# Patient Record
Sex: Female | Born: 1945 | Race: Black or African American | Hispanic: No | Smoking: Never smoker
Health system: Southern US, Community
[De-identification: ages and names within clinical notes are randomized; demographics above are authoritative.]

## PROBLEM LIST (undated history)

## (undated) DIAGNOSIS — E785 Hyperlipidemia, unspecified: Secondary | ICD-10-CM

## (undated) DIAGNOSIS — K219 Gastro-esophageal reflux disease without esophagitis: Secondary | ICD-10-CM

## (undated) DIAGNOSIS — I10 Essential (primary) hypertension: Secondary | ICD-10-CM

## (undated) DIAGNOSIS — I639 Cerebral infarction, unspecified: Secondary | ICD-10-CM

## (undated) HISTORY — DX: Cerebral infarction, unspecified: I63.9

## (undated) HISTORY — PX: KNEE SURGERY: SHX244

## (undated) HISTORY — PX: ABDOMINAL HYSTERECTOMY: SHX81

---

## 2012-02-23 ENCOUNTER — Other Ambulatory Visit: Payer: Self-pay | Admitting: Family Medicine

## 2012-02-23 DIAGNOSIS — Z1231 Encounter for screening mammogram for malignant neoplasm of breast: Secondary | ICD-10-CM

## 2012-03-26 ENCOUNTER — Ambulatory Visit
Admission: RE | Admit: 2012-03-26 | Discharge: 2012-03-26 | Disposition: A | Discharge status: Home / Self Care | Payer: Medicare PPO | Source: Ambulatory Visit | Attending: Family Medicine | Admitting: Family Medicine

## 2012-03-26 DIAGNOSIS — Z1231 Encounter for screening mammogram for malignant neoplasm of breast: Secondary | ICD-10-CM

## 2013-03-18 ENCOUNTER — Other Ambulatory Visit: Payer: Self-pay

## 2013-03-18 DIAGNOSIS — Z1231 Encounter for screening mammogram for malignant neoplasm of breast: Secondary | ICD-10-CM

## 2013-04-17 ENCOUNTER — Ambulatory Visit
Admission: RE | Admit: 2013-04-17 | Discharge: 2013-04-17 | Disposition: A | Discharge status: Home / Self Care | Payer: Medicare PPO | Source: Ambulatory Visit

## 2013-04-17 DIAGNOSIS — Z1231 Encounter for screening mammogram for malignant neoplasm of breast: Secondary | ICD-10-CM

## 2014-03-19 ENCOUNTER — Other Ambulatory Visit: Payer: Self-pay

## 2014-03-19 DIAGNOSIS — Z1231 Encounter for screening mammogram for malignant neoplasm of breast: Secondary | ICD-10-CM

## 2014-04-30 ENCOUNTER — Encounter (INDEPENDENT_AMBULATORY_CARE_PROVIDER_SITE_OTHER): Payer: Self-pay

## 2014-04-30 ENCOUNTER — Ambulatory Visit
Admission: RE | Admit: 2014-04-30 | Discharge: 2014-04-30 | Disposition: A | Discharge status: Home / Self Care | Payer: Commercial Managed Care - HMO | Source: Ambulatory Visit

## 2014-04-30 DIAGNOSIS — Z1231 Encounter for screening mammogram for malignant neoplasm of breast: Secondary | ICD-10-CM

## 2014-05-05 ENCOUNTER — Other Ambulatory Visit: Payer: Self-pay | Admitting: Family Medicine

## 2014-05-05 DIAGNOSIS — R928 Other abnormal and inconclusive findings on diagnostic imaging of breast: Secondary | ICD-10-CM

## 2014-05-25 ENCOUNTER — Other Ambulatory Visit: Payer: Commercial Managed Care - HMO

## 2014-05-26 ENCOUNTER — Ambulatory Visit
Admission: RE | Admit: 2014-05-26 | Discharge: 2014-05-26 | Disposition: A | Discharge status: Home / Self Care | Payer: Commercial Managed Care - HMO | Source: Ambulatory Visit | Attending: Family Medicine | Admitting: Family Medicine

## 2014-05-26 DIAGNOSIS — R928 Other abnormal and inconclusive findings on diagnostic imaging of breast: Secondary | ICD-10-CM

## 2014-11-17 DIAGNOSIS — K219 Gastro-esophageal reflux disease without esophagitis: Secondary | ICD-10-CM | POA: Diagnosis not present

## 2014-11-17 DIAGNOSIS — I10 Essential (primary) hypertension: Secondary | ICD-10-CM | POA: Diagnosis not present

## 2015-04-28 DIAGNOSIS — D123 Benign neoplasm of transverse colon: Secondary | ICD-10-CM | POA: Diagnosis not present

## 2015-04-28 DIAGNOSIS — D126 Benign neoplasm of colon, unspecified: Secondary | ICD-10-CM | POA: Diagnosis not present

## 2015-04-28 DIAGNOSIS — K5730 Diverticulosis of large intestine without perforation or abscess without bleeding: Secondary | ICD-10-CM | POA: Diagnosis not present

## 2015-04-28 DIAGNOSIS — Z1211 Encounter for screening for malignant neoplasm of colon: Secondary | ICD-10-CM | POA: Diagnosis not present

## 2015-04-28 DIAGNOSIS — K640 First degree hemorrhoids: Secondary | ICD-10-CM | POA: Diagnosis not present

## 2015-05-18 DIAGNOSIS — R200 Anesthesia of skin: Secondary | ICD-10-CM | POA: Diagnosis not present

## 2015-05-18 DIAGNOSIS — Z136 Encounter for screening for cardiovascular disorders: Secondary | ICD-10-CM | POA: Diagnosis not present

## 2015-05-18 DIAGNOSIS — K219 Gastro-esophageal reflux disease without esophagitis: Secondary | ICD-10-CM | POA: Diagnosis not present

## 2015-05-18 DIAGNOSIS — I10 Essential (primary) hypertension: Secondary | ICD-10-CM | POA: Diagnosis not present

## 2015-05-18 DIAGNOSIS — Z1389 Encounter for screening for other disorder: Secondary | ICD-10-CM | POA: Diagnosis not present

## 2015-05-18 DIAGNOSIS — E559 Vitamin D deficiency, unspecified: Secondary | ICD-10-CM | POA: Diagnosis not present

## 2015-05-18 DIAGNOSIS — Z0000 Encounter for general adult medical examination without abnormal findings: Secondary | ICD-10-CM | POA: Diagnosis not present

## 2015-05-25 DIAGNOSIS — Z23 Encounter for immunization: Secondary | ICD-10-CM | POA: Diagnosis not present

## 2015-08-12 ENCOUNTER — Other Ambulatory Visit: Payer: Self-pay

## 2015-08-12 DIAGNOSIS — Z1231 Encounter for screening mammogram for malignant neoplasm of breast: Secondary | ICD-10-CM

## 2015-08-30 ENCOUNTER — Ambulatory Visit
Admission: RE | Admit: 2015-08-30 | Discharge: 2015-08-30 | Disposition: A | Discharge status: Home / Self Care | Payer: Commercial Managed Care - HMO | Source: Ambulatory Visit

## 2015-08-30 DIAGNOSIS — Z1231 Encounter for screening mammogram for malignant neoplasm of breast: Secondary | ICD-10-CM

## 2016-01-27 DIAGNOSIS — I10 Essential (primary) hypertension: Secondary | ICD-10-CM | POA: Diagnosis not present

## 2016-02-24 ENCOUNTER — Observation Stay (HOSPITAL_COMMUNITY)
Admission: EM | Admit: 2016-02-24 | Discharge: 2016-02-26 | Disposition: A | Discharge status: Home / Self Care | Payer: Commercial Managed Care - HMO | Attending: Internal Medicine | Admitting: Internal Medicine

## 2016-02-24 ENCOUNTER — Emergency Department (HOSPITAL_COMMUNITY): Payer: Commercial Managed Care - HMO

## 2016-02-24 ENCOUNTER — Encounter (HOSPITAL_COMMUNITY): Payer: Self-pay | Admitting: Emergency Medicine

## 2016-02-24 DIAGNOSIS — E860 Dehydration: Secondary | ICD-10-CM | POA: Insufficient documentation

## 2016-02-24 DIAGNOSIS — R42 Dizziness and giddiness: Secondary | ICD-10-CM | POA: Diagnosis not present

## 2016-02-24 DIAGNOSIS — H818X9 Other disorders of vestibular function, unspecified ear: Secondary | ICD-10-CM

## 2016-02-24 DIAGNOSIS — I10 Essential (primary) hypertension: Secondary | ICD-10-CM | POA: Insufficient documentation

## 2016-02-24 DIAGNOSIS — H8110 Benign paroxysmal vertigo, unspecified ear: Secondary | ICD-10-CM

## 2016-02-24 DIAGNOSIS — Z823 Family history of stroke: Secondary | ICD-10-CM | POA: Insufficient documentation

## 2016-02-24 DIAGNOSIS — K219 Gastro-esophageal reflux disease without esophagitis: Secondary | ICD-10-CM | POA: Insufficient documentation

## 2016-02-24 DIAGNOSIS — G459 Transient cerebral ischemic attack, unspecified: Secondary | ICD-10-CM | POA: Diagnosis not present

## 2016-02-24 DIAGNOSIS — E785 Hyperlipidemia, unspecified: Secondary | ICD-10-CM | POA: Diagnosis not present

## 2016-02-24 DIAGNOSIS — Z6833 Body mass index (BMI) 33.0-33.9, adult: Secondary | ICD-10-CM | POA: Insufficient documentation

## 2016-02-24 DIAGNOSIS — Z7982 Long term (current) use of aspirin: Secondary | ICD-10-CM | POA: Insufficient documentation

## 2016-02-24 DIAGNOSIS — E669 Obesity, unspecified: Secondary | ICD-10-CM | POA: Diagnosis not present

## 2016-02-24 DIAGNOSIS — I672 Cerebral atherosclerosis: Secondary | ICD-10-CM | POA: Diagnosis not present

## 2016-02-24 DIAGNOSIS — R404 Transient alteration of awareness: Secondary | ICD-10-CM | POA: Diagnosis not present

## 2016-02-24 DIAGNOSIS — R2990 Unspecified symptoms and signs involving the nervous system: Secondary | ICD-10-CM | POA: Insufficient documentation

## 2016-02-24 DIAGNOSIS — E876 Hypokalemia: Secondary | ICD-10-CM | POA: Insufficient documentation

## 2016-02-24 DIAGNOSIS — Z8249 Family history of ischemic heart disease and other diseases of the circulatory system: Secondary | ICD-10-CM | POA: Insufficient documentation

## 2016-02-24 HISTORY — DX: Essential (primary) hypertension: I10

## 2016-02-24 HISTORY — DX: Hyperlipidemia, unspecified: E78.5

## 2016-02-24 HISTORY — DX: Gastro-esophageal reflux disease without esophagitis: K21.9

## 2016-02-24 LAB — CBC WITH DIFFERENTIAL/PLATELET
BASOS ABS: 0 10*3/uL (ref 0.0–0.1)
BASOS PCT: 0 %
EOS ABS: 0.2 10*3/uL (ref 0.0–0.7)
Eosinophils Relative: 2 %
HCT: 37.4 % (ref 36.0–46.0)
Hemoglobin: 11.8 g/dL — ABNORMAL LOW (ref 12.0–15.0)
LYMPHS ABS: 1.6 10*3/uL (ref 0.7–4.0)
Lymphocytes Relative: 16 %
MCH: 26.4 pg (ref 26.0–34.0)
MCHC: 31.6 g/dL (ref 30.0–36.0)
MCV: 83.7 fL (ref 78.0–100.0)
Monocytes Absolute: 0.5 10*3/uL (ref 0.1–1.0)
Monocytes Relative: 5 %
NEUTROS PCT: 77 %
Neutro Abs: 7.7 10*3/uL (ref 1.7–7.7)
PLATELETS: 400 10*3/uL (ref 150–400)
RBC: 4.47 MIL/uL (ref 3.87–5.11)
RDW: 15 % (ref 11.5–15.5)
WBC: 10.1 10*3/uL (ref 4.0–10.5)

## 2016-02-24 LAB — COMPREHENSIVE METABOLIC PANEL
ALT: 19 U/L (ref 14–54)
AST: 13 U/L — ABNORMAL LOW (ref 15–41)
Albumin: 3.9 g/dL (ref 3.5–5.0)
Alkaline Phosphatase: 75 U/L (ref 38–126)
Anion gap: 7 (ref 5–15)
BILIRUBIN TOTAL: 0.6 mg/dL (ref 0.3–1.2)
BUN: 14 mg/dL (ref 6–20)
CHLORIDE: 106 mmol/L (ref 101–111)
CO2: 27 mmol/L (ref 22–32)
Calcium: 9.6 mg/dL (ref 8.9–10.3)
Creatinine, Ser: 0.72 mg/dL (ref 0.44–1.00)
Glucose, Bld: 109 mg/dL — ABNORMAL HIGH (ref 65–99)
Potassium: 3.4 mmol/L — ABNORMAL LOW (ref 3.5–5.1)
Sodium: 140 mmol/L (ref 135–145)
Total Protein: 6.7 g/dL (ref 6.5–8.1)

## 2016-02-24 LAB — URINALYSIS, ROUTINE W REFLEX MICROSCOPIC
BILIRUBIN URINE: NEGATIVE
Glucose, UA: NEGATIVE mg/dL
Hgb urine dipstick: NEGATIVE
KETONES UR: NEGATIVE mg/dL
Nitrite: NEGATIVE
Protein, ur: NEGATIVE mg/dL
Specific Gravity, Urine: 1.013 (ref 1.005–1.030)
pH: 6.5 (ref 5.0–8.0)

## 2016-02-24 LAB — URINE MICROSCOPIC-ADD ON: RBC / HPF: NONE SEEN RBC/hpf (ref 0–5)

## 2016-02-24 LAB — I-STAT TROPONIN, ED: TROPONIN I, POC: 0 ng/mL (ref 0.00–0.08)

## 2016-02-24 MED ORDER — ASPIRIN EC 81 MG PO TBEC: 81.0000 mg | DELAYED_RELEASE_TABLET | Freq: Every day | ORAL | Status: DC

## 2016-02-24 MED ORDER — STROKE: EARLY STAGES OF RECOVERY BOOK
Freq: Once | Status: DC
  Filled 2016-02-24 (×2): qty 1

## 2016-02-24 NOTE — ED Triage Notes (Addendum)
Pt BIB EMS. 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 Pt pulled to side of road and called 911. Hx of HTN, HLD, GERD. Pt states she takes lisinopril for HTN and is compliant with it, no change to dosage recently. Per EMS, pt was initially hypertensive @ 170/97. Denies CP, SOB, N/V/D, problems urinating or defecating.   Pt presently denies continued dizziness, blurred vision.

## 2016-02-24 NOTE — ED Provider Notes (Addendum)
[facility] DEPT Provider Note   CSN: [id number] Arrival date & time: 02/24/16  1919     History   Chief Complaint Chief Complaint  Patient presents with  . Hypertension  . Dizziness    HPI Teresa Maddox is a 70 y.o. female.  HPI   Patient is a 70 year old female with past medical history significant for hypertension hyperlipidemia presenting today with acute onset of dizziness. Patient was driving down the street and developed acute feelings of the road spinning. Patient was able to navigate her car off the highway. However even when getting to the side of the road she still felt like she could not see straight. This lasted roughly 20 minutes until EMS was able to be called and arrived. Patient didn't feel like she could get out of the car or stand straight she would fall right over. Patient had no shortness of breath at the time. EMS measured her blood pressure and told her "it's very high". She had no chest pain  She's had feelings of lightheadedness in the past and seen PCP and was told that it is likely dehydration.  Past Medical History:  Diagnosis Date  . GERD (gastroesophageal reflux disease)   . HLD (hyperlipidemia)   . Hypertension     There are no active problems to display for this patient.   History reviewed. No pertinent surgical history.  OB History    No data available       Home Medications    Prior to Admission medications   Medication Sig Start Date End Date Taking? Authorizing Provider  AMLODIPINE BESYLATE PO Take 1 tablet by mouth daily.   Yes Historical Provider, MD  cholecalciferol (VITAMIN D) 1000 units tablet Take 1,000 Units by mouth daily.   Yes Historical Provider, MD  LISINOPRIL PO Take 1 tablet by mouth daily.   Yes Historical Provider, MD  OMEPRAZOLE PO Take 1 tablet by mouth daily.   Yes Historical Provider, MD  vitamin C (ASCORBIC ACID) 500 MG tablet Take 500 mg by mouth daily.   Yes Historical Provider, MD    Family  History No family history on file.  Social History Social History  Substance Use Topics  . Smoking status: Never Smoker  . Smokeless tobacco: Never Used  . Alcohol use No     Allergies   Clindamycin/lincomycin   Review of Systems Review of Systems  Constitutional: Negative for activity change.  Respiratory: Negative for shortness of breath.   Cardiovascular: Negative for chest pain.  Gastrointestinal: Negative for abdominal pain.  All other systems reviewed and are negative.    Physical Exam Updated Vital Signs BP 138/86   Pulse 71   Temp 98.2 F (36.8 C) (Oral)   Resp 17   Ht 5\' 6"  (1.676 m)   Wt 205 lb (93 kg)   SpO2 98%   BMI 33.09 kg/m   Physical Exam  Constitutional: She is oriented to person, place, and time. She appears well-developed and well-nourished.  HENT:  Head: Normocephalic and atraumatic.  Eyes: Right eye exhibits no discharge.  Cardiovascular: Normal rate.   Pulmonary/Chest: Effort normal.  Abdominal: Soft. There is no tenderness.  Neurological: She is oriented to person, place, and time. No cranial nerve deficit.  Equal strength bilaterally upper and lower extremities negative pronator drift. Normal sensation bilaterally. Speech comprehensible, no slurring. Facial nerve tested and appears grossly normal. Alert and oriented 3.   Skin: Skin is warm and dry. She is not diaphoretic.  Psychiatric: She has  a normal mood and affect.  Nursing note and vitals reviewed.    ED Treatments / Results  Labs (all labs ordered are listed, but only abnormal results are displayed) Labs Reviewed  COMPREHENSIVE METABOLIC PANEL - Abnormal; Notable for the following:       Result Value   Potassium 3.4 (*)    Glucose, Bld 109 (*)    AST 13 (*)    All other components within normal limits  CBC WITH DIFFERENTIAL/PLATELET - Abnormal; Notable for the following:    Hemoglobin 11.8 (*)    All other components within normal limits  URINE CULTURE  URINALYSIS,  ROUTINE W REFLEX MICROSCOPIC (NOT AT [facility])  HEMOGLOBIN A1C  LIPID PANEL  I-STAT TROPOININ, ED    EKG  EKG Interpretation  Date/Time:  Thursday February 24 2016 19:31:06 EDT Ventricular Rate:  60 PR Interval:    QRS Duration: 95 QT Interval:  425 QTC Calculation: 425 R Axis:   26 Text Interpretation:  Sinus rhythm Probable left ventricular hypertrophy Borderline T abnormalities, diffuse leads no significan signs of ischemia noted Confirmed by Gerald Leitz ([id number]) on 02/24/2016 7:33:55 PM       Radiology Dg Chest 2 View  Result Date: 02/24/2016 CLINICAL DATA:  70 year old female with acute onset dizziness. Hypertension on presentation. Initial encounter. EXAM: CHEST  2 VIEW COMPARISON:  None. FINDINGS: Mild cardiomegaly. Mild tortuosity of the thoracic aorta. Mild Calcified aortic atherosclerosis. Visualized tracheal air column is within normal limits. No pneumothorax, pulmonary edema or pleural effusion. Mild increased interstitial markings diffusely. Curvilinear probable scarring at the left lung base. No other confluent pulmonary opacity. No acute osseous abnormality identified. IMPRESSION: No acute cardiopulmonary abnormality. Electronically Signed   By: Genevie Ann M.D.   On: 02/24/2016 21:38   Ct Head Wo Contrast  Result Date: 02/24/2016 CLINICAL DATA:  70 year old female with acute onset dizziness. Hypertension on presentation. Initial encounter. EXAM: CT HEAD WITHOUT CONTRAST TECHNIQUE: Contiguous axial images were obtained from the base of the skull through the vertex without intravenous contrast. COMPARISON:  None. FINDINGS: Brain: Scattered patchy bilateral cerebral white matter hypodensity. No areas of cortical encephalomalacia identified. Posterior fossa gray-white matter differentiation appears normal. Cerebral volume is within normal limits for age. No midline shift, ventriculomegaly, mass effect, evidence of mass lesion, intracranial hemorrhage or evidence of cortically  based acute infarction. Gray-white matter differentiation is within normal limits throughout the brain. Vascular: Mild Calcified atherosclerosis at the skull base. No suspicious intracranial vascular hyperdensity. Skull: Calvarium appears intact. No acute osseous abnormality identified. Sinuses/Orbits: Visualized paranasal sinuses and mastoids are well pneumatized. Other: Suggestion of mild right forehead scalp contused in (series 202, image 9). Otherwise negative visualized orbit and scalp soft tissues. IMPRESSION: 1.  No acute intracranial abnormality. 2. Scattered cerebral white matter hypodensity, favor chronic small vessel disease related. 3. Suggestion of right forehead scalp contusion. No underlying fracture. Electronically Signed   By: Genevie Ann M.D.   On: 02/24/2016 21:41    Procedures Procedures (including critical care time)  Medications Ordered in ED Medications   stroke: mapping our early stages of recovery book (not administered)  aspirin EC tablet 81 mg (not administered)     Initial Impression / Assessment and Plan / ED Course  I have reviewed the triage vital signs and the nursing notes.  Pertinent labs & imaging results that were available during my care of the patient were reviewed by me and considered in my medical decision making (see chart for details).  Clinical Course  Patient is a 70 year old female with past medical history significant for hypertension hyperlipidemia presenting today with acute onset of dizziness. Tonight however happen after movement. Therefore think BPPV is less likely. Even acute onset and stopping I do not think that it is likely to be dehydration.  I'm concerned this could be a posterior TIA. Will initiate workup here in the emergency department, touch base with neurology. Likely admit for TIA rule out.  11:06 PM Neurology is Artie seeing patient. They agree that the story does not fit any clear etiology. Cannot rule out posterior TIA so will  admit for TIA rule out.   Final Clinical Impressions(s) / ED Diagnoses   Final diagnoses:  Vertigo  Vertigo  Vertigo    New Prescriptions New Prescriptions   No medications on file     Symone Cornman Julio Alm, MD 02/24/16 2016    Nicko Daher Lyn Jailey Booton, MD 02/24/16 2306

## 2016-02-24 NOTE — ED Notes (Signed)
Patient transported to X-ray 

## 2016-02-24 NOTE — Consult Note (Signed)
Neurology Consult Note  Reason for Consultation: Vertigo  Requesting provider: Zenovia Jarred, MD  CC: "everything was spinning"  HPI: This is a 70-yo RH woman who presents to the [facility] ED via EMS for evaluation of an episode of dizziness. History is obtained directly from the patient who is an excellent historian.   She reports that she was driving today when everything suddenly started spinning. She denies any associated nausea or vomiting. There was no headache, double vision, trouble speaking or swallowing, weakness, or numbness. She was able to pull over to the side of the road but did not try to get out of the car because she was certain that she would fall if she did. She called 911 and EMS arrived to transport her to the hospital. She estimates that her symptoms lasted 10-15 minutes and then completely resolved. She has been asymptomatic and back to her usual baseline since.  She reports that she has had several prior episodes of dizziness over the past couple of years. They are similar to what she experienced today, consisting of mild vertigo without associated symptoms. This lasts for a few minutes and then resolve. These episodes often come on while she is standing and she is not able to identify any clear provoking factor. She has mentioned them to her PCP and reports they have been attributed to dehydration and her blood pressure in the past.   PMH:  Past Medical History:  Diagnosis Date  . GERD (gastroesophageal reflux disease)   . HLD (hyperlipidemia)   . Hypertension     PSH:  History reviewed. No pertinent surgical history.  Family history: She reports numerous family members with DM. Her mother had a stroke and fibromyalgia.   Social history:  She is divorced. She is retired but continues to work part-time. She denies any history of tobacco use. She denies alcohol or illicit drug use.    Current outpatient meds: Current Meds  Medication Sig  . AMLODIPINE  BESYLATE PO Take 1 tablet by mouth daily.  . cholecalciferol (VITAMIN D) 1000 units tablet Take 1,000 Units by mouth daily.  Marland Kitchen LISINOPRIL PO Take 1 tablet by mouth daily.  Marland Kitchen OMEPRAZOLE PO Take 1 tablet by mouth daily.  . vitamin C (ASCORBIC ACID) 500 MG tablet Take 500 mg by mouth daily.    Current inpatient meds:  No current facility-administered medications for this encounter.    Current Outpatient Prescriptions  Medication Sig Dispense Refill  . AMLODIPINE BESYLATE PO Take 1 tablet by mouth daily.    . cholecalciferol (VITAMIN D) 1000 units tablet Take 1,000 Units by mouth daily.    Marland Kitchen LISINOPRIL PO Take 1 tablet by mouth daily.    Marland Kitchen OMEPRAZOLE PO Take 1 tablet by mouth daily.    . vitamin C (ASCORBIC ACID) 500 MG tablet Take 500 mg by mouth daily.      Allergies: Allergies  Allergen Reactions  . Clindamycin/Lincomycin Itching    ROS: As per HPI. A full 14-point review of systems was performed and is otherwise unremarkable.   PE:  BP 138/86   Pulse 71   Temp 98.2 F (36.8 C) (Oral)   Resp 17   Ht 5\' 6"  (1.676 m)   Wt 93 kg (205 lb)   SpO2 98%   BMI 33.09 kg/m   General: WDWN, no acute distress. AAO x4. Speech clear, no dysarthria. No aphasia. Follows commands briskly. Affect is bright with congruent mood. Comportment is normal.  HEENT: Normocephalic. Neck supple  without LAD. MMM, OP clear. Dentition good. Sclerae anicteric. No conjunctival injection.  CV: Regular, no murmur. Carotid pulses full and symmetric, no bruits. Distal pulses 2+ and symmetric.  Lungs: CTAB.  Abdomen: Soft, non-distended, non-tender. Bowel sounds present x4.  Extremities: No C/C/E. Neuro:  CN: Pupils are equal and round. They are symmetrically reactive from 3-->2 mm. EOMI without nystagmus. No reported diplopia. Facial sensation is intact to light touch. Face is symmetric at rest with normal strength and mobility. Hearing is intact to conversational voice. Palate elevates symmetrically and uvula  is midline. Voice is normal in tone, pitch and quality. Bilateral SCM and trapezii are 5/5. Tongue is midline with normal bulk and mobility.  Motor: Normal bulk, tone, and strength. No tremor or other abnormal movements. No drift.  Sensation: Intact to light touch.  DTRs: 3+, symmetric. Toes downgoing bilaterally. No pathologic reflexes.  Coordination: Finger-to-nose and heel-to-shin are without dysmetria. Finger taps are normal in amplitude and speed, no decrement.     Labs:  Lab Results  Component Value Date   WBC 10.1 02/24/2016   HGB 11.8 (L) 02/24/2016   HCT 37.4 02/24/2016   PLT 400 02/24/2016   GLUCOSE 109 (H) 02/24/2016   ALT 19 02/24/2016   AST 13 (L) 02/24/2016   NA 140 02/24/2016   K 3.4 (L) 02/24/2016   CL 106 02/24/2016   CREATININE 0.72 02/24/2016   BUN 14 02/24/2016   CO2 27 02/24/2016    Imaging:  I have personally and independently reviewed the [facility] without contrast from today. This shows a mild degree of chronic small vessel ischemic change in the bihemispheric white matter. There is no evidence of acute pathology.   Assessment and Plan:  1. Vertigo: She reports transient vertigo earlier today with several similar (though less intense) episodes in the past. While today's episode could represent a posterior circulation TIA, the history of prior episodes argues somewhat against this. In addition, she did not experience any other symptoms of brainstem ischemia. That said, she has some risk factors for cerebral ischemia, including HTN, hyperlipidemia, and age. Vertebrobasilar insufficiency must also be considered. This does not appear to be BPPV as her symptoms are not provoked by head movement. She lacks any features to support Meniere's disease. Her recurrent episodes and lack of prodrome exclude viral/postviral labyrinthitis. Migraine equivalent could be considered, particularly given her reported history of motion sickness.   Recommend:  MRI brain  MRA head and  neck to evaluate posterior circulation  TTE  Fasting lipids  Hemoglobin a1c  Aspirin 81 mg daily (she was not taking aspirin regularly at home)  Consider atorvastatin  This was discussed with the patient and she is in agreement with the plan as noted. She was given the opportunity to ask any questions and these were addressed to her satisfaction. The case was also discussed with Dr. Thomasene Lot.   The stroke team will follow up starting on 02/25/16.

## 2016-02-25 ENCOUNTER — Encounter (HOSPITAL_COMMUNITY): Payer: Self-pay | Admitting: Internal Medicine

## 2016-02-25 ENCOUNTER — Observation Stay (HOSPITAL_COMMUNITY): Payer: Commercial Managed Care - HMO

## 2016-02-25 DIAGNOSIS — G458 Other transient cerebral ischemic attacks and related syndromes: Secondary | ICD-10-CM | POA: Diagnosis not present

## 2016-02-25 DIAGNOSIS — R42 Dizziness and giddiness: Secondary | ICD-10-CM | POA: Diagnosis not present

## 2016-02-25 DIAGNOSIS — G459 Transient cerebral ischemic attack, unspecified: Secondary | ICD-10-CM | POA: Diagnosis present

## 2016-02-25 DIAGNOSIS — E876 Hypokalemia: Secondary | ICD-10-CM

## 2016-02-25 DIAGNOSIS — E785 Hyperlipidemia, unspecified: Secondary | ICD-10-CM

## 2016-02-25 DIAGNOSIS — I10 Essential (primary) hypertension: Secondary | ICD-10-CM | POA: Diagnosis not present

## 2016-02-25 DIAGNOSIS — H818X9 Other disorders of vestibular function, unspecified ear: Secondary | ICD-10-CM

## 2016-02-25 DIAGNOSIS — H8110 Benign paroxysmal vertigo, unspecified ear: Secondary | ICD-10-CM

## 2016-02-25 LAB — COMPREHENSIVE METABOLIC PANEL
ALT: 17 U/L (ref 14–54)
ANION GAP: 6 (ref 5–15)
AST: 13 U/L — ABNORMAL LOW (ref 15–41)
Albumin: 3.5 g/dL (ref 3.5–5.0)
Alkaline Phosphatase: 74 U/L (ref 38–126)
BILIRUBIN TOTAL: 0.4 mg/dL (ref 0.3–1.2)
BUN: 11 mg/dL (ref 6–20)
CO2: 26 mmol/L (ref 22–32)
Calcium: 9.4 mg/dL (ref 8.9–10.3)
Chloride: 109 mmol/L (ref 101–111)
Creatinine, Ser: 0.68 mg/dL (ref 0.44–1.00)
GFR calc Af Amer: >60 mL/min (ref >60)
GFR calc non Af Amer: >60 mL/min (ref >60)
Glucose, Bld: 105 mg/dL — ABNORMAL HIGH (ref 65–99)
POTASSIUM: 3.2 mmol/L — AB (ref 3.5–5.1)
Sodium: 141 mmol/L (ref 135–145)
TOTAL PROTEIN: 5.9 g/dL — AB (ref 6.5–8.1)

## 2016-02-25 LAB — CBC
HEMATOCRIT: 34.7 % — AB (ref 36.0–46.0)
Hemoglobin: 11.1 g/dL — ABNORMAL LOW (ref 12.0–15.0)
MCH: 26.6 pg (ref 26.0–34.0)
MCHC: 32 g/dL (ref 30.0–36.0)
MCV: 83 fL (ref 78.0–100.0)
Platelets: 376 10*3/uL (ref 150–400)
RBC: 4.18 MIL/uL (ref 3.87–5.11)
RDW: 14.8 % (ref 11.5–15.5)
WBC: 8.7 10*3/uL (ref 4.0–10.5)

## 2016-02-25 LAB — LIPID PANEL
CHOLESTEROL: 187 mg/dL (ref 0–200)
HDL: 80 mg/dL (ref >40)
LDL Cholesterol: 96 mg/dL (ref 0–99)
TRIGLYCERIDES: 54 mg/dL (ref <150)
Total CHOL/HDL Ratio: 2.3 RATIO
VLDL: 11 mg/dL (ref 0–40)

## 2016-02-25 LAB — RAPID URINE DRUG SCREEN, HOSP PERFORMED
AMPHETAMINES: NOT DETECTED
BARBITURATES: NOT DETECTED
BENZODIAZEPINES: POSITIVE — AB
COCAINE: NOT DETECTED
Opiates: NOT DETECTED
TETRAHYDROCANNABINOL: NOT DETECTED

## 2016-02-25 MED ORDER — LORAZEPAM 2 MG/ML IJ SOLN
1.0000 mg | Freq: Once | INTRAMUSCULAR | Status: AC
  Administered 2016-02-25: 1 mg via INTRAVENOUS
  Filled 2016-02-25: qty 1

## 2016-02-25 MED ORDER — ALPRAZOLAM 0.5 MG PO TABS
0.5000 mg | ORAL_TABLET | Freq: Once | ORAL | Status: AC
  Administered 2016-02-25: 0.5 mg via ORAL
  Filled 2016-02-25: qty 1

## 2016-02-25 MED ORDER — SODIUM CHLORIDE 0.9 % IV SOLN
INTRAVENOUS | Status: AC
  Administered 2016-02-25: 03:00:00 via INTRAVENOUS

## 2016-02-25 MED ORDER — POTASSIUM CHLORIDE CRYS ER 20 MEQ PO TBCR
40.0000 meq | EXTENDED_RELEASE_TABLET | Freq: Once | ORAL | Status: AC
  Administered 2016-02-25: 40 meq via ORAL
  Filled 2016-02-25: qty 2

## 2016-02-25 MED ORDER — ATORVASTATIN CALCIUM 10 MG PO TABS: 20.0000 mg | ORAL_TABLET | Freq: Every day | ORAL | Status: DC

## 2016-02-25 MED ORDER — ENOXAPARIN SODIUM 40 MG/0.4ML ~~LOC~~ SOLN
40.0000 mg | SUBCUTANEOUS | Status: DC
  Administered 2016-02-25 – 2016-02-26 (×2): 40 mg via SUBCUTANEOUS
  Filled 2016-02-25 (×2): qty 0.4

## 2016-02-25 MED ORDER — ASPIRIN 300 MG RE SUPP: 300.0000 mg | Freq: Every day | RECTAL | Status: DC

## 2016-02-25 MED ORDER — AMLODIPINE BESYLATE 10 MG PO TABS
10.0000 mg | ORAL_TABLET | Freq: Every day | ORAL | Status: DC
  Filled 2016-02-25: qty 1

## 2016-02-25 MED ORDER — ASPIRIN 325 MG PO TABS
325.0000 mg | ORAL_TABLET | Freq: Every day | ORAL | Status: DC
  Administered 2016-02-25 – 2016-02-26 (×2): 325 mg via ORAL
  Filled 2016-02-25 (×2): qty 1

## 2016-02-25 MED ORDER — LISINOPRIL 20 MG PO TABS
20.0000 mg | ORAL_TABLET | Freq: Every day | ORAL | Status: DC
  Administered 2016-02-25 – 2016-02-26 (×2): 20 mg via ORAL
  Filled 2016-02-25 (×2): qty 1

## 2016-02-25 MED ORDER — GADOBENATE DIMEGLUMINE 529 MG/ML IV SOLN
20.0000 mL | Freq: Once | INTRAVENOUS | Status: AC
  Administered 2016-02-25: 20 mL via INTRAVENOUS

## 2016-02-25 MED ORDER — STROKE: EARLY STAGES OF RECOVERY BOOK
Freq: Once | Status: AC
  Administered 2016-02-25: 03:00:00
  Filled 2016-02-25: qty 1

## 2016-02-25 MED ORDER — PANTOPRAZOLE SODIUM 40 MG PO TBEC
40.0000 mg | DELAYED_RELEASE_TABLET | Freq: Every day | ORAL | Status: DC
  Administered 2016-02-25 – 2016-02-26 (×2): 40 mg via ORAL
  Filled 2016-02-25 (×2): qty 1

## 2016-02-25 NOTE — Progress Notes (Signed)
STROKE TEAM PROGRESS NOTE   HISTORY OF PRESENT ILLNESS (per record) This is a 70-yo RH woman who presents to the [facility] ED via EMS for evaluation of an episode of dizziness. History is obtained directly from the patient who is an excellent historian.   She reports that she was driving today when everything suddenly started spinning. She denies any associated nausea or vomiting. There was no headache, double vision, trouble speaking or swallowing, weakness, or numbness. She was able to pull over to the side of the road but did not try to get out of the car because she was certain that she would fall if she did. She called 911 and EMS arrived to transport her to the hospital. She estimates that her symptoms lasted 10-15 minutes and then completely resolved. She has been asymptomatic and back to her usual baseline since.  She reports that she has had several prior episodes of dizziness over the past couple of years. They are similar to what she experienced today, consisting of mild vertigo without associated symptoms. This lasts for a few minutes and then resolve. These episodes often come on while she is standing and she is not able to identify any clear provoking factor. She has mentioned them to her PCP and reports they have been attributed to dehydration and her blood pressure in the past.    SUBJECTIVE (INTERVAL HISTORY) Her RN is at the bedside.  Overall she feels her condition is rapidly improving. She stated that she has hx of sudden onset dizziness/vertigo for the last a couple of years but scattered and very brief. For the last 2 weeks, however, she had 3 episodes which is more frequent than before. Yesterday, she was driving and sudden onset vertigo lasting 10-10min, which was never happened before. She denies LOC, HA, ear ringing, N/V, visual changes, hearing loss, recent infection, etc. Currently, she felt some woozy on motion but way better.    OBJECTIVE Temp:  [97.9 F (36.6 C)-98.2  F (36.8 C)] 97.9 F (36.6 C) (09/15 0620) Pulse Rate:  [59-71] 61 (09/15 0620) Cardiac Rhythm: Normal sinus rhythm (09/15 0131) Resp:  [15-18] 16 (09/15 0620) BP: (121-144)/(67-86) 122/74 (09/15 0620) SpO2:  [96 %-100 %] 96 % (09/15 0620) Weight:  [93 kg (205 lb)] 93 kg (205 lb) (09/14 1950)  CBC:  Recent Labs Lab 02/24/16 2017 02/25/16 0219  WBC 10.1 8.7  NEUTROABS 7.7  --   HGB 11.8* 11.1*  HCT 37.4 34.7*  MCV 83.7 83.0  PLT 400 [id number]    Basic Metabolic Panel:  Recent Labs Lab 02/24/16 2017 02/25/16 0219  NA 140 141  K 3.4* 3.2*  CL 106 109  CO2 27 26  GLUCOSE 109* 105*  BUN 14 11  CREATININE 0.72 0.68  CALCIUM 9.6 9.4    Lipid Panel:    Component Value Date/Time   CHOL 187 02/25/2016 0219   TRIG 54 02/25/2016 0219   HDL 80 02/25/2016 0219   CHOLHDL 2.3 02/25/2016 0219   VLDL 11 02/25/2016 0219   LDLCALC 96 02/25/2016 0219   HgbA1c: No results found for: HGBA1C Urine Drug Screen: No results found for: LABOPIA, COCAINSCRNUR, LABBENZ, AMPHETMU, THCU, LABBARB    IMAGING I have personally reviewed the radiological images below and agree with the radiology interpretations.  Dg Chest 2 View 02/24/2016 No acute cardiopulmonary abnormality.   Ct Head Wo Contrast 02/24/2016 1.  No acute intracranial abnormality.  2. Scattered cerebral white matter hypodensity, favor chronic small vessel disease related.  3. Suggestion of right forehead scalp contusion. No underlying fracture.   MRI / MRA Head and Neck 02/25/2016 1. No acute intracranial abnormality. 2. Nonspecific mild to moderate generalized cerebellar volume loss. 3. Bilateral cerebral white matter signal changes favored due to chronic small vessel disease, mild to moderate for age. 4. Negative head and neck MRA aside from vessel tortuosity. Minimal to mild carotid bifurcation and ICA siphon atherosclerosis without stenosis.  TTE pending   PHYSICAL EXAM General: WDWN, no acute distress. AAO x4. Speech  clear, no dysarthria. No aphasia. Follows commands briskly. Affect is bright with congruent mood. Comportment is normal.  HEENT: Normocephalic. Neck supple without LAD. MMM, OP clear. Dentition good. Sclerae anicteric. No conjunctival injection.  CV: Regular, no murmur. Carotid pulses full and symmetric, no bruits. Distal pulses 2+ and symmetric.  Lungs: CTAB.  Abdomen: Soft, non-distended, non-tender. Bowel sounds present x4.  Extremities: No C/C/E.  Neuro:  CN: Pupils are equal and round. They are symmetrically reactive from 3-->2 mm. EOMI without nystagmus. No reported diplopia. Facial sensation is intact to light touch. Face is symmetric at rest with normal strength and mobility. Hearing is intact to conversational voice. Palate elevates symmetrically and uvula is midline. Voice is normal in tone, pitch and quality. Bilateral SCM and trapezii are 5/5. Tongue is midline with normal bulk and mobility.  Motor: Normal bulk, tone, and strength. No tremor or other abnormal movements. No drift.  Sensation: Intact to light touch.  DTRs: 2+, symmetric. Toes downgoing bilaterally. No pathologic reflexes.  Coordination: Finger-to-nose and heel-to-shin are without dysmetria. Finger taps are normal in amplitude and speed, no decrement.  Dix-Hallpike test: negative although felt briefly woozy on sitting up.   ASSESSMENT/PLAN Ms. Teresa Maddox is a 70 y.o. female with history of hypertension, hyperlipidemia, and gastroesophageal reflux disease of  presenting with transient dizziness. She did not receive IV t-PA due to resolution of deficits.  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 Less likely TIA, vestibular neuritis or Meniere's disease  Resultant  resolution of deficits.  MRI - No acute intracranial abnormality.  MRA - Negative head and neck MRA   2D Echo - pending  LDL - 96  HgbA1c pending  VTE prophylaxis - Lovenox  Diet Heart Room service appropriate? Yes; Fluid consistency: Thin  No  antithrombotic prior to admission, now on aspirin 325 mg daily. Continue ASA on discharge.  Patient counseled to be compliant with her antithrombotic medications  Ongoing aggressive stroke risk factor management  Therapy recommendations: No follow-up therapies recommended.  Disposition: pending  Consider outpt ENT referral  Hypertension  Stable  Long-term BP goal normotensive  Check BP at home  Hyperlipidemia  Home meds: none  LDL 96, goal < 70  Add Lipitor 20 mg daily  Continue statin at discharge  Other Stroke Risk Factors  Advanced age  Obesity, Body mass index is 33.09 kg/m., recommend weight loss, diet and exercise as appropriate   Other Active Problems  Mild anemia  Hypokalemia - 3.2 - supplemented  Hospital day # 0  Neurology will sign off. Please call with questions. Pt will follow up with Dr. Erlinda Hong at [facility] in about 4 weeks. Thanks for the consult.   Rosalin Hawking, MD PhD Stroke Neurology 02/25/2016 10:06 PM  To contact Stroke Continuity provider, please refer to [email or web address] After hours, contact General Neurology

## 2016-02-25 NOTE — Care Management Obs Status (Signed)
[facility] NOTIFICATION   Patient Details  Name: Teresa Maddox MRN: [id number] Date of Birth: [birth year]   Medicare Observation Status Notification Given:  Yes    Carles Collet, RN 02/25/2016, 2:01 PM
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
## 2016-02-25 NOTE — Progress Notes (Signed)
Report given to 5c and patient will be transferred.  Patient also informed of the transfer.
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
## 2016-02-25 NOTE — Evaluation (Signed)
Physical Therapy Evaluation Patient Details Name: Teresa Maddox MRN: [id number] DOB: July 23, 1945 Today's Date: 02/25/2016   History of Present Illness  70 y.o. female admitted for dizziness and blurry vision. PMH significant for HTN, HLD, and GERD.  Clinical Impression  Pt is at or close to baseline functioning and should be safe at home and back to work.  Recommended pt have someone ride with her the first time she drives, but otherwise no f/u needs. There are no further acute PT needs.  Will sign off at this time.     Follow Up Recommendations No PT follow up    Equipment Recommendations  None recommended by PT    Recommendations for Other Services       Precautions / Restrictions Precautions Precautions: None Restrictions Weight Bearing Restrictions: No      Mobility  Bed Mobility Overal bed mobility: Independent                Transfers Overall transfer level: Independent                  Ambulation/Gait Ambulation/Gait assistance: Independent Ambulation Distance (Feet): 600 Feet Assistive device: None Gait Pattern/deviations: WFL(Within Functional Limits)   Gait velocity interpretation: at or above normal speed for age/gender General Gait Details: steady and safe  Stairs Stairs: Yes   Stair Management: One rail Right;Alternating pattern;Forwards Number of Stairs: 4 General stair comments: safe with rail  Wheelchair Mobility    Modified Rankin (Stroke Patients Only)       Balance Overall balance assessment: Independent                               Standardized Balance Assessment Standardized Balance Assessment : Dynamic Gait Index   Dynamic Gait Index Level Surface: Normal Change in Gait Speed: Normal Gait with Horizontal Head Turns: Normal Gait with Vertical Head Turns: Mild Impairment Gait and Pivot Turn: Normal Step Over Obstacle: Normal Step Around Obstacles: Normal Steps: Mild Impairment Total Score: 22        Pertinent Vitals/Pain Pain Assessment: No/denies pain    Home Living Family/patient expects to be discharged to:: Private residence Living Arrangements: Alone Available Help at Discharge: Friend(s);Available PRN/intermittently Type of Home: Apartment Home Access: Level entry     Home Layout: One level Home Equipment: None      Prior Function Level of Independence: Independent         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; drives     Journalist, newspaper   Dominant Hand: Right    Extremity/Trunk Assessment   Upper Extremity Assessment: Overall WFL for tasks assessed           Lower Extremity Assessment: Overall WFL for tasks assessed      Cervical / Trunk Assessment: Normal  Communication   Communication: No difficulties  Cognition Arousal/Alertness: Awake/alert Behavior During Therapy: WFL for tasks assessed/performed Overall Cognitive Status: Within Functional Limits for tasks assessed                      General Comments General comments (skin integrity, edema, etc.): Generally any sign of vertigo have resolved with exception of "weird" feeling with scanning repeatedly up/down.    Exercises     Assessment/Plan    PT Assessment Patent does not need any further PT services  PT Problem List         PT Diagnosis   Dizziness  Vertigo - Plan: MR Brain W Wo Contrast, MR Brain W Wo Contrast, MR Angiogram Neck W Wo Contrast, MR Angiogram Neck W Wo Contrast    PT Treatment Interventions      PT Goals (Current goals can be found in the Care Plan section)  Acute Rehab PT Goals Patient Stated Goal: to go home PT Goal Formulation: With patient    Frequency     Barriers to discharge        Co-evaluation               End of Session   Activity Tolerance: Patient tolerated treatment well Patient left: in bed;with bed alarm set;with call bell/phone within reach Nurse Communication: Mobility status     Functional Assessment Tool Used: clinical judgement Functional Limitation: Mobility: Walking and moving around Mobility: Walking and Moving Around Current Status [id number]): 0 percent impaired, limited or restricted Mobility: Walking and Moving Around Goal Status [id number]): 0 percent impaired, limited or restricted Mobility: Walking and Moving Around Discharge Status [phone]): 0 percent impaired, limited or restricted    Time: 1219-1240 PT Time Calculation (min) (ACUTE ONLY): 21 min   Charges:   PT Evaluation $PT Eval Low Complexity: 1 Procedure     PT G Codes:   PT G-Codes **NOT FOR INPATIENT CLASS** Functional Assessment Tool Used: clinical judgement Functional Limitation: Mobility: Walking and moving around Mobility: Walking and Moving Around Current Status [id number]): 0 percent impaired, limited or restricted Mobility: Walking and Moving Around Goal Status [id number]): 0 percent impaired, limited or restricted Mobility: Walking and Moving Around Discharge Status [phone]): 0 percent impaired, limited or restricted    Chasty Randal, Tessie Fass 02/25/2016, 12:55 PM 02/25/2016  Donnella Sham, [place] [phone]  (pager)

## 2016-02-25 NOTE — Progress Notes (Addendum)
Pt arrived floor. Admission completed, Pt placed on telemetry. MD notified. Swallow screen passed, MD notified. Pt says she is claustrophobic and would like to have something prior to going for MRI. Pt was prescribed Xanax and administered at around 1:30am. MRI was called but pt was pushed back due to Emergency pt scheduled. MRI tech was called and she said she would call as soon as there is an avalable slot. Pt is asleep and doing well. Will continue to monitor q2 neuro and will send downstairs to MRI as soon as there is an available slot for the procedure.  2 additional follow up calls were placed to MRI to check for available space for pt. Pt's nurse was informed that there were multiple Emergency patients and could not get to her quite yet. Will continue to monitor and will pass on to the next shift with the information.

## 2016-02-25 NOTE — Evaluation (Signed)
Occupational Therapy Evaluation/Discharge Patient Details Name: Teresa Maddox MRN: [id number] DOB: September 01, 1945 Today's Date: 02/25/2016    History of Present Illness 70 y.o. female admitted for dizziness and blurry vision. PMH significant for HTN, HLD, and GERD.   Clinical Impression   Patient has been evaluated by Occupational Therapy with no acute OT needs identified. Pt was able to complete all basic ADLs and transfers independently with no balance or vision concerns. All education has been completed and pt has no further questions. Pt with no further acute OT needs. OT signing off. Thank you for this referral.    Follow Up Recommendations  No OT follow up    Equipment Recommendations  None recommended by OT    Recommendations for Other Services       Precautions / Restrictions Precautions Precautions: None Restrictions Weight Bearing Restrictions: No      Mobility Bed Mobility Overal bed mobility: Independent                Transfers Overall transfer level: Independent                    Balance Overall balance assessment: No apparent balance deficits (not formally assessed)                                          ADL Overall ADL's : Independent                                             Vision Vision Assessment?: Yes Eye Alignment: Within Functional Limits Ocular Range of Motion: Within Functional Limits Alignment/Gaze Preference: Within Defined Limits Tracking/Visual Pursuits: Able to track stimulus in all quads without difficulty Saccades: Within functional limits Convergence: Within functional limits Visual Fields: No apparent deficits   Agricultural engineer 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 Perception Deficits: Inattention/neglect Inattention/Neglect: Appears intact   Praxis Praxis Praxis tested?: Within functional limits    Pertinent Vitals/Pain Pain Assessment: No/denies pain     Hand  Dominance Right   Extremity/Trunk Assessment Upper Extremity Assessment Upper Extremity Assessment: Overall WFL for tasks assessed   Lower Extremity Assessment Lower Extremity Assessment: Overall WFL for tasks assessed   Cervical / Trunk Assessment Cervical / Trunk Assessment: Normal   Communication Communication Communication: No difficulties   Cognition Arousal/Alertness: Awake/alert Behavior During Therapy: WFL for tasks assessed/performed Overall Cognitive Status: Within Functional Limits for tasks assessed                     General Comments       Exercises       Shoulder Instructions      Home Living Family/patient expects to be discharged to:: Private residence Living Arrangements: Alone Available Help at Discharge: Friend(s);Available PRN/intermittently Type of Home: Apartment Home Access: Level entry     Home Layout: One level     Bathroom Shower/Tub: Tub/shower unit Shower/tub characteristics: Architectural technologist: Standard     Home Equipment: None          Prior Functioning/Environment Level of Independence: Independent        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[employer]; drives    OT Diagnosis:   Dizziness  Vertigo    OT Problem List:     OT  Treatment/Interventions:      OT Goals(Current goals can be found in the care plan section) Acute Rehab OT Goals Patient Stated Goal: to go home OT Goal Formulation: With patient Time For Goal Achievement: 03/10/16 Potential to Achieve Goals: Good  OT Frequency:     Barriers to D/C:            Co-evaluation              End of Session Equipment Utilized During Treatment: Gait belt Nurse Communication: Mobility status  Activity Tolerance: Patient tolerated treatment well Patient left: in bed;with call bell/phone within reach   Time: 0811-0825 OT Time Calculation (min): 14 min Charges:  OT General Charges $OT Visit: 1 Procedure OT Evaluation $OT  Eval Low Complexity: 1 Procedure G-Codes: OT G-codes **NOT FOR INPATIENT CLASS** Functional Assessment Tool Used: clinical judgement Functional Limitation: Self care Self Care Current Status [id number]): 0 percent impaired, limited or restricted Self Care Goal Status [id number]): 0 percent impaired, limited or restricted Self Care Discharge Status [id number]): 0 percent impaired, limited or restricted  Redmond Baseman, OTR/L Pager: [phone] 02/25/2016, 10:21 AM

## 2016-02-26 ENCOUNTER — Other Ambulatory Visit (HOSPITAL_COMMUNITY): Payer: Commercial Managed Care - HMO

## 2016-02-26 ENCOUNTER — Observation Stay (HOSPITAL_BASED_OUTPATIENT_CLINIC_OR_DEPARTMENT_OTHER): Payer: Commercial Managed Care - HMO

## 2016-02-26 DIAGNOSIS — G459 Transient cerebral ischemic attack, unspecified: Secondary | ICD-10-CM

## 2016-02-26 DIAGNOSIS — E785 Hyperlipidemia, unspecified: Secondary | ICD-10-CM | POA: Diagnosis not present

## 2016-02-26 DIAGNOSIS — E876 Hypokalemia: Secondary | ICD-10-CM

## 2016-02-26 DIAGNOSIS — I10 Essential (primary) hypertension: Secondary | ICD-10-CM | POA: Diagnosis not present

## 2016-02-26 LAB — ECHOCARDIOGRAM COMPLETE
CHL CUP MV DEC (S): 246
E/e' ratio: 8.57
EWDT: 246 ms
FS: 35 % (ref 28–44)
Height: 66 in
IV/PV OW: 1.28
LA ID, A-P, ES: 42 mm
LA diam end sys: 42 mm
LA vol: 57.9 mL
LADIAMINDEX: 2.08 cm/m2
LAVOLA4C: 56.6 mL
LAVOLIN: 28.7 mL/m2
LDCA: 3.14 cm2
LV E/e'average: 8.57
LV PW d: 10.3 mm — AB (ref 0.6–1.1)
LV TDI E'LATERAL: 10.3
LV TDI E'MEDIAL: 8.59
LVEEMED: 8.57
LVELAT: 10.3 cm/s
LVOT diameter: 20 mm
Lateral S' vel: 21.6 cm/s
MV pk A vel: 102 m/s
MV pk E vel: 88.3 m/s
MVPG: 3 mmHg
Weight: 3280 oz

## 2016-02-26 LAB — HEMOGLOBIN A1C
Hgb A1c MFr Bld: 5.9 % — ABNORMAL HIGH (ref 4.8–5.6)
Mean Plasma Glucose: 123 mg/dL

## 2016-02-26 LAB — BASIC METABOLIC PANEL
ANION GAP: 8 (ref 5–15)
BUN: 11 mg/dL (ref 6–20)
CALCIUM: 9.4 mg/dL (ref 8.9–10.3)
CO2: 28 mmol/L (ref 22–32)
Chloride: 108 mmol/L (ref 101–111)
Creatinine, Ser: 0.7 mg/dL (ref 0.44–1.00)
Glucose, Bld: 101 mg/dL — ABNORMAL HIGH (ref 65–99)
POTASSIUM: 3.8 mmol/L (ref 3.5–5.1)
SODIUM: 144 mmol/L (ref 135–145)

## 2016-02-26 LAB — MAGNESIUM: Magnesium: 1.8 mg/dL (ref 1.7–2.4)

## 2016-02-26 MED ORDER — ATORVASTATIN CALCIUM 20 MG PO TABS: 20.0000 mg | ORAL_TABLET | Freq: Every day | ORAL | 1 refills | Status: AC

## 2016-02-26 MED ORDER — LISINOPRIL 20 MG PO TABS: 20.0000 mg | ORAL_TABLET | Freq: Every day | ORAL | 0 refills | Status: AC

## 2016-02-26 MED ORDER — OMEPRAZOLE 20 MG PO CPDR: 20.0000 mg | DELAYED_RELEASE_CAPSULE | Freq: Every day | ORAL | 0 refills | Status: AC

## 2016-02-26 NOTE — Progress Notes (Signed)
Pt d/c to home by car with family. Assessment stable. All questions answered. 
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
## 2016-02-26 NOTE — Progress Notes (Signed)
  Echocardiogram 2D Echocardiogram has been performed.  Diamond Nickel 02/26/2016, 12:59 PM

## 2016-02-26 NOTE — Discharge Instructions (Signed)
Transient Ischemic Attack °A transient ischemic attack (TIA) is a "warning stroke" that causes stroke-like symptoms. Unlike a stroke, a TIA does not cause permanent damage to the brain. The symptoms of a TIA can happen very fast and do not last long. It is important to know the symptoms of a TIA and what to do. This can help prevent a major stroke or death. °CAUSES  °A TIA is caused by a temporary blockage in an artery in the brain or neck (carotid artery). The blockage does not allow the brain to get the blood supply it needs and can cause different symptoms. The blockage can be caused by either: °· A blood clot. °· Fatty buildup (plaque) in a neck or brain artery. °RISK FACTORS °· High blood pressure (hypertension). °· High cholesterol. °· Diabetes mellitus. °· Heart disease. °· The buildup of plaque in the blood vessels (peripheral artery disease or atherosclerosis). °· The buildup of plaque in the blood vessels that provide blood and oxygen to the brain (carotid artery stenosis). °· An abnormal heart rhythm (atrial fibrillation). °· Obesity. °· Using any tobacco products, including cigarettes, chewing tobacco, or electronic cigarettes. °· Taking oral contraceptives, especially in combination with using tobacco. °· Physical inactivity. °· A diet high in fats, salt (sodium), and calories. °· Excessive alcohol use. °· Use of illegal drugs (especially cocaine and methamphetamine). °· Being female. °· Being African American. °· Being over the age of 55 years. °· Family history of stroke. °· Previous history of blood clots, stroke, TIA, or heart attack. °· Sickle cell disease. °SIGNS AND SYMPTOMS  °TIA symptoms are the same as a stroke but are temporary. These symptoms usually develop suddenly, or may be newly present upon waking from sleep: °· Sudden weakness or numbness of the face, arm, or leg, especially on one side of the body. °· Sudden trouble walking or difficulty moving arms or legs. °· Sudden  confusion. °· Sudden personality changes. °· Trouble speaking (aphasia) or understanding. °· Difficulty swallowing. °· Sudden trouble seeing in one or both eyes. °· Double vision. °· Dizziness. °· Loss of balance or coordination. °· Sudden severe headache with no known cause. °· Trouble reading or writing. °· Loss of bowel or bladder control. °· Loss of consciousness. °DIAGNOSIS  °Your health care provider may be able to determine the presence or absence of a TIA based on your symptoms, history, and physical exam. CT scan of the brain is usually performed to help identify a TIA. Other tests may include: °· Electrocardiography (ECG). °· Continuous heart monitoring. °· Echocardiography. °· Carotid ultrasonography. °· MRI. °· A scan of the brain circulation. °· Blood tests. °TREATMENT  °Since the symptoms of TIA are the same as a stroke, it is important to seek treatment as soon as possible. You may need a medicine to dissolve a blood clot (thrombolytic) if that is the cause of the TIA. This medicine cannot be given if too much time has passed. Treatment may also include:  °· Rest, oxygen, fluids through an IV tube, and medicines to thin the blood (anticoagulants). °· Measures will be taken to prevent short-term and long-term complications, including infection from breathing foreign material into the lungs (aspiration pneumonia), blood clots in the legs, and falls. °· Procedures to either remove plaque in the carotid arteries or dilate carotid arteries that have narrowed due to plaque. Those procedures are: °¨ Carotid endarterectomy. °¨ Carotid angioplasty and stenting. °· Medicines and diet may be used to address diabetes, high blood pressure, and   other underlying risk factors. °HOME CARE INSTRUCTIONS  °· Take medicines only as directed by your health care provider. Follow the directions carefully. Medicines may be used to control risk factors for a stroke. Be sure you understand all your medicine instructions. °· You  may be told to take aspirin or the anticoagulant warfarin. Warfarin needs to be taken exactly as instructed. °¨ Taking too much or too little warfarin is dangerous. Too much warfarin increases the risk of bleeding. Too little warfarin continues to allow the risk for blood clots. While taking warfarin, you will need to have regular blood tests to measure your blood clotting time. A PT blood test measures how long it takes for blood to clot. Your PT is used to calculate another value called an INR. Your PT and INR help your health care provider to adjust your dose of warfarin. The dose can change for many reasons. It is critically important that you take warfarin exactly as prescribed. °¨ Many foods, especially foods high in vitamin K can interfere with warfarin and affect the PT and INR. Foods high in vitamin K include spinach, kale, broccoli, cabbage, collard and turnip greens, Brussels sprouts, peas, cauliflower, seaweed, and parsley, as well as beef and pork liver, green tea, and soybean oil. You should eat a consistent amount of foods high in vitamin K. Avoid major changes in your diet, or notify your health care provider before changing your diet. Arrange a visit with a dietitian to answer your questions. °¨ Many medicines can interfere with warfarin and affect the PT and INR. You must tell your health care provider about any and all medicines you take; this includes all vitamins and supplements. Be especially cautious with aspirin and anti-inflammatory medicines. Do not take or discontinue any prescribed or over-the-counter medicine except on the advice of your health care provider or pharmacist. °¨ Warfarin can have side effects, such as excessive bruising or bleeding. You will need to hold pressure over cuts for longer than usual. Your health care provider or pharmacist will discuss other potential side effects. °¨ Avoid sports or activities that may cause injury or bleeding. °¨ Be careful when shaving,  flossing your teeth, or handling sharp objects. °¨ Alcohol can change the body's ability to handle warfarin. It is best to avoid alcoholic drinks or consume only very small amounts while taking warfarin. Notify your health care provider if you change your alcohol intake. °¨ Notify your dentist or other health care providers before procedures. °· Eat a diet that includes 5 or more servings of fruits and vegetables each day. This may reduce the risk of stroke. Certain diets may be prescribed to address high blood pressure, high cholesterol, diabetes, or obesity. °¨ A diet low in sodium, saturated fat, trans fat, and cholesterol is recommended to manage high blood pressure. °¨ A diet low in saturated fat, trans fat, and cholesterol, and high in fiber may control cholesterol levels. °¨ A controlled-carbohydrate, controlled-sugar diet is recommended to manage diabetes. °¨ A reduced-calorie diet that is low in sodium, saturated fat, trans fat, and cholesterol is recommended to manage obesity. °· Maintain a healthy weight. °· Stay physically active. It is recommended that you get at least 30 minutes of activity on most or all days. °· Do not use any tobacco products, including cigarettes, chewing tobacco, or electronic cigarettes. If you need help quitting, ask your health care provider. °· Limit alcohol intake to no more than 1 drink per day for nonpregnant women and 2 drinks   per day for men. One drink equals 12 ounces of beer, 5 ounces of wine, or 1½ ounces of hard liquor. °· Do not abuse drugs. °· A safe home environment is important to reduce the risk of falls. Your health care provider may arrange for specialists to evaluate your home. Having grab bars in the bedroom and bathroom is often important. Your health care provider may arrange for equipment to be used at home, such as raised toilets and a seat for the shower. °· Follow all instructions for follow-up with your health care provider. This is very important.  This includes any referrals and lab tests. Proper follow-up can prevent a stroke or another TIA from occurring. °PREVENTION  °The risk of a TIA can be decreased by appropriately treating high blood pressure, high cholesterol, diabetes, heart disease, and obesity, and by quitting smoking, limiting alcohol, and staying physically active. °SEEK MEDICAL CARE IF: °· You have personality changes. °· You have difficulty swallowing. °· You are seeing double. °· You have dizziness. °· You have a fever. °SEEK IMMEDIATE MEDICAL CARE IF:  °Any of the following symptoms may represent a serious problem that is an emergency. Do not wait to see if the symptoms will go away. Get medical help right away. Call your local emergency services (911 in [place] Do not drive yourself to the hospital. °· You have sudden weakness or numbness of the face, arm, or leg, especially on one side of the body. °· You have sudden trouble walking or difficulty moving arms or legs. °· You have sudden confusion. °· You have trouble speaking (aphasia) or understanding. °· You have sudden trouble seeing in one or both eyes. °· You have a loss of balance or coordination. °· You have a sudden, severe headache with no known cause. °· You have new chest pain or an irregular heartbeat. °· You have a partial or total loss of consciousness. °MAKE SURE YOU:  °· Understand these instructions. °· Will watch your condition. °· Will get help right away if you are not doing well or get worse. °  °This information is not intended to replace advice given to you by your health care provider. Make sure you discuss any questions you have with your health care provider. °  °Document Released: 03/08/2005 Document Revised: 06/19/2014 Document Reviewed: 09/03/2013 °Elsevier Interactive Patient Education ©2016 [employer] ° °

## 2016-02-27 LAB — URINE CULTURE

## 2016-02-28 DIAGNOSIS — R55 Syncope and collapse: Secondary | ICD-10-CM | POA: Diagnosis not present

## 2016-03-29 ENCOUNTER — Emergency Department (HOSPITAL_COMMUNITY): Payer: Worker's Compensation

## 2016-03-29 ENCOUNTER — Emergency Department (HOSPITAL_COMMUNITY)
Admission: EM | Admit: 2016-03-29 | Discharge: 2016-03-30 | Disposition: A | Discharge status: Home / Self Care | Payer: Worker's Compensation | Attending: Emergency Medicine | Admitting: Emergency Medicine

## 2016-03-29 ENCOUNTER — Encounter (HOSPITAL_COMMUNITY): Payer: Self-pay

## 2016-03-29 DIAGNOSIS — Y939 Activity, unspecified: Secondary | ICD-10-CM | POA: Insufficient documentation

## 2016-03-29 DIAGNOSIS — I10 Essential (primary) hypertension: Secondary | ICD-10-CM | POA: Diagnosis not present

## 2016-03-29 DIAGNOSIS — Y92410 Unspecified street and highway as the place of occurrence of the external cause: Secondary | ICD-10-CM | POA: Insufficient documentation

## 2016-03-29 DIAGNOSIS — S32019A Unspecified fracture of first lumbar vertebra, initial encounter for closed fracture: Secondary | ICD-10-CM | POA: Diagnosis not present

## 2016-03-29 DIAGNOSIS — N390 Urinary tract infection, site not specified: Secondary | ICD-10-CM

## 2016-03-29 DIAGNOSIS — Z8673 Personal history of transient ischemic attack (TIA), and cerebral infarction without residual deficits: Secondary | ICD-10-CM | POA: Insufficient documentation

## 2016-03-29 DIAGNOSIS — R918 Other nonspecific abnormal finding of lung field: Secondary | ICD-10-CM | POA: Diagnosis not present

## 2016-03-29 DIAGNOSIS — Y999 Unspecified external cause status: Secondary | ICD-10-CM | POA: Insufficient documentation

## 2016-03-29 DIAGNOSIS — S0990XA Unspecified injury of head, initial encounter: Secondary | ICD-10-CM | POA: Diagnosis not present

## 2016-03-29 DIAGNOSIS — S3992XA Unspecified injury of lower back, initial encounter: Secondary | ICD-10-CM | POA: Diagnosis present

## 2016-03-29 DIAGNOSIS — R109 Unspecified abdominal pain: Secondary | ICD-10-CM | POA: Diagnosis not present

## 2016-03-29 LAB — COMPREHENSIVE METABOLIC PANEL
ALBUMIN: 4 g/dL (ref 3.5–5.0)
ALT: 29 U/L (ref 14–54)
AST: 29 U/L (ref 15–41)
Alkaline Phosphatase: 94 U/L (ref 38–126)
Anion gap: 8 (ref 5–15)
BILIRUBIN TOTAL: 0.5 mg/dL (ref 0.3–1.2)
BUN: 12 mg/dL (ref 6–20)
CALCIUM: 9.9 mg/dL (ref 8.9–10.3)
CO2: 26 mmol/L (ref 22–32)
Chloride: 108 mmol/L (ref 101–111)
Creatinine, Ser: 0.78 mg/dL (ref 0.44–1.00)
GFR calc Af Amer: >60 mL/min (ref >60)
Glucose, Bld: 93 mg/dL (ref 65–99)
POTASSIUM: 3.5 mmol/L (ref 3.5–5.1)
Sodium: 142 mmol/L (ref 135–145)
Total Protein: 7.1 g/dL (ref 6.5–8.1)

## 2016-03-29 LAB — URINALYSIS, ROUTINE W REFLEX MICROSCOPIC
Bilirubin Urine: NEGATIVE
GLUCOSE, UA: NEGATIVE mg/dL
Hgb urine dipstick: NEGATIVE
Ketones, ur: NEGATIVE mg/dL
LEUKOCYTES UA: NEGATIVE
NITRITE: POSITIVE — AB
PROTEIN: NEGATIVE mg/dL
Specific Gravity, Urine: 1.012 (ref 1.005–1.030)
pH: 7 (ref 5.0–8.0)

## 2016-03-29 LAB — I-STAT CHEM 8, ED
BUN: 14 mg/dL (ref 6–20)
CHLORIDE: 106 mmol/L (ref 101–111)
CREATININE: 0.7 mg/dL (ref 0.44–1.00)
Calcium, Ion: 1.2 mmol/L (ref 1.15–1.40)
Glucose, Bld: 89 mg/dL (ref 65–99)
HEMATOCRIT: 38 % (ref 36.0–46.0)
Hemoglobin: 12.9 g/dL (ref 12.0–15.0)
POTASSIUM: 3.5 mmol/L (ref 3.5–5.1)
SODIUM: 144 mmol/L (ref 135–145)
TCO2: 26 mmol/L (ref 0–100)

## 2016-03-29 LAB — PROTIME-INR
INR: 0.87
PROTHROMBIN TIME: 11.8 s (ref 11.4–15.2)

## 2016-03-29 LAB — CBC
HCT: 37.2 % (ref 36.0–46.0)
HEMOGLOBIN: 12.1 g/dL (ref 12.0–15.0)
MCH: 26.7 pg (ref 26.0–34.0)
MCHC: 32.5 g/dL (ref 30.0–36.0)
MCV: 82.1 fL (ref 78.0–100.0)
Platelets: 364 10*3/uL (ref 150–400)
RBC: 4.53 MIL/uL (ref 3.87–5.11)
RDW: 14.4 % (ref 11.5–15.5)
WBC: 12.7 10*3/uL — AB (ref 4.0–10.5)

## 2016-03-29 LAB — ETHANOL

## 2016-03-29 LAB — SAMPLE TO BLOOD BANK

## 2016-03-29 LAB — URINE MICROSCOPIC-ADD ON: RBC / HPF: NONE SEEN RBC/hpf (ref 0–5)

## 2016-03-29 LAB — I-STAT CG4 LACTIC ACID, ED: LACTIC ACID, VENOUS: 0.91 mmol/L (ref 0.5–1.9)

## 2016-03-29 MED ORDER — FENTANYL CITRATE (PF) 100 MCG/2ML IJ SOLN
50.0000 ug | Freq: Once | INTRAMUSCULAR | Status: AC
  Administered 2016-03-29: 50 ug via INTRAVENOUS
  Filled 2016-03-29: qty 2

## 2016-03-29 MED ORDER — HYDROCODONE-ACETAMINOPHEN 5-325 MG PO TABS
2.0000 | ORAL_TABLET | Freq: Once | ORAL | Status: AC
  Administered 2016-03-29: 2 via ORAL
  Filled 2016-03-29: qty 2

## 2016-03-29 MED ORDER — IOPAMIDOL (ISOVUE-370) INJECTION 76%
INTRAVENOUS | Status: AC
  Administered 2016-03-29: 100 mL
  Filled 2016-03-29: qty 100

## 2016-03-29 NOTE — ED Notes (Signed)
Contacted [employer] at this time to place TLSO brace at this time. Per ortho it will take the on-call vendor 90-120 minutes to arrive to place the brace.  MD notified.

## 2016-03-29 NOTE — ED Notes (Signed)
Patient is physically being moved from hallway B to room B17 at this time
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
## 2016-03-29 NOTE — Progress Notes (Signed)
Orthopedic Tech Progress Note Patient Details:  Teresa Maddox [birth year] [id number] Called in TLSO brace order to [facility]. Patient ID: Teresa Maddox, female   DOB: 26-Dec-1945, 70 y.o.   MRN: [id number]   [name] 03/29/2016, 10:26 PM
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
## 2016-03-29 NOTE — ED Notes (Signed)
Patient is on LSB

## 2016-03-30 ENCOUNTER — Other Ambulatory Visit: Payer: Self-pay

## 2016-03-30 LAB — CDS SEROLOGY

## 2016-03-30 MED ORDER — HYDROCODONE-ACETAMINOPHEN 5-325 MG PO TABS: 2.0000 | ORAL_TABLET | Freq: Four times a day (QID) | ORAL | 0 refills | Status: AC | PRN

## 2016-03-30 MED ORDER — CEPHALEXIN 500 MG PO CAPS: 500.0000 mg | ORAL_CAPSULE | Freq: Four times a day (QID) | ORAL | 0 refills | Status: DC

## 2016-03-30 NOTE — ED Notes (Signed)
Attempted to d/c pt at this time. Pt states that she will remain in her room until someone arrives with clothes for her. Pt states that she does not know when her friend will arrive with clothes.

## 2016-03-30 NOTE — ED Notes (Signed)
Pt provided with d/c instructions at this time. Pt verbalizes understanding of d/c instructions as well as follow up procedure after d/c.  Pt provided with RX for norco.  Pt verbalizes understanding of RX directions. Pt in no apparent distress at this time.  Pt assisted out of the ED via [place] at this time.

## 2016-04-01 LAB — URINE CULTURE

## 2016-04-02 ENCOUNTER — Telehealth (HOSPITAL_BASED_OUTPATIENT_CLINIC_OR_DEPARTMENT_OTHER): Payer: Self-pay

## 2016-04-02 NOTE — Telephone Encounter (Signed)
Post ED Visit - Positive Culture Follow-up  Culture report reviewed by antimicrobial stewardship pharmacist:  []  Elenor Quinones, Pharm.D. []  Heide Guile, Pharm.D., BCPS []  Parks Neptune, Pharm.D. []  Alycia Rossetti, Pharm.D., BCPS [x]  [place], Pharm.D., BCPS, AAHIVP []  Legrand Como, Pharm.D., BCPS, AAHIVP []  Milus Glazier, Pharm.D. []  Stephens November, Pharm.D.  Positive urine culture Treated with Cephalexin, organism sensitive to the same and no further patient follow-up is required at this time.  Genia Del 04/02/2016, 11:05 AM

## 2016-05-22 ENCOUNTER — Encounter: Payer: Self-pay | Admitting: Neurology

## 2016-05-22 ENCOUNTER — Ambulatory Visit (INDEPENDENT_AMBULATORY_CARE_PROVIDER_SITE_OTHER): Payer: Commercial Managed Care - HMO | Admitting: Neurology

## 2016-05-22 VITALS — BP 163/96 | HR 77 | Ht 66.0 in | Wt 204.6 lb

## 2016-05-22 DIAGNOSIS — R42 Dizziness and giddiness: Secondary | ICD-10-CM

## 2016-05-22 DIAGNOSIS — E785 Hyperlipidemia, unspecified: Secondary | ICD-10-CM

## 2016-05-22 DIAGNOSIS — H8110 Benign paroxysmal vertigo, unspecified ear: Secondary | ICD-10-CM

## 2016-05-22 DIAGNOSIS — I10 Essential (primary) hypertension: Secondary | ICD-10-CM

## 2016-05-22 DIAGNOSIS — H818X9 Other disorders of vestibular function, unspecified ear: Secondary | ICD-10-CM

## 2016-05-22 NOTE — Patient Instructions (Addendum)
-   continue ASA and lipitor for stroke prevention - check BP at home and record and bring over to PCP for medication adjustment if needed. BP goal 120-140 - Follow up with your primary care physician for stroke risk factor modification. Recommend maintain blood pressure goal <140/80, diabetes with hemoglobin A1c goal below 7.0% and lipids with LDL cholesterol goal below 70 mg/dL.  - relaxation and destress your self - let us know if dizziness happens again. If it is too frequent, we will think about medications.  - follow up in 4 months.
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
## 2016-08-03 DIAGNOSIS — Z23 Encounter for immunization: Secondary | ICD-10-CM | POA: Diagnosis not present

## 2016-09-14 DIAGNOSIS — E559 Vitamin D deficiency, unspecified: Secondary | ICD-10-CM | POA: Diagnosis not present

## 2016-09-14 DIAGNOSIS — E7800 Pure hypercholesterolemia, unspecified: Secondary | ICD-10-CM | POA: Diagnosis not present

## 2016-09-14 DIAGNOSIS — K219 Gastro-esophageal reflux disease without esophagitis: Secondary | ICD-10-CM | POA: Diagnosis not present

## 2016-09-14 DIAGNOSIS — I10 Essential (primary) hypertension: Secondary | ICD-10-CM | POA: Diagnosis not present

## 2016-09-14 DIAGNOSIS — Z0000 Encounter for general adult medical examination without abnormal findings: Secondary | ICD-10-CM | POA: Diagnosis not present

## 2016-09-14 DIAGNOSIS — Z1389 Encounter for screening for other disorder: Secondary | ICD-10-CM | POA: Diagnosis not present

## 2016-09-25 ENCOUNTER — Ambulatory Visit: Payer: Commercial Managed Care - HMO | Admitting: Neurology

## 2016-09-25 ENCOUNTER — Telehealth: Payer: Self-pay | Admitting: *Deleted
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
## 2016-10-12 ENCOUNTER — Other Ambulatory Visit: Payer: Self-pay | Admitting: Family Medicine

## 2016-10-12 DIAGNOSIS — Z1231 Encounter for screening mammogram for malignant neoplasm of breast: Secondary | ICD-10-CM

## 2016-10-31 ENCOUNTER — Ambulatory Visit
Admission: RE | Admit: 2016-10-31 | Discharge: 2016-10-31 | Disposition: A | Discharge status: Home / Self Care | Payer: Commercial Managed Care - HMO | Source: Ambulatory Visit | Attending: Family Medicine | Admitting: Family Medicine

## 2016-10-31 DIAGNOSIS — Z1231 Encounter for screening mammogram for malignant neoplasm of breast: Secondary | ICD-10-CM | POA: Diagnosis not present

## 2016-12-08 DIAGNOSIS — I10 Essential (primary) hypertension: Secondary | ICD-10-CM | POA: Diagnosis not present

## 2016-12-14 DIAGNOSIS — I10 Essential (primary) hypertension: Secondary | ICD-10-CM | POA: Diagnosis not present

## 2016-12-15 DIAGNOSIS — M545 Low back pain: Secondary | ICD-10-CM | POA: Diagnosis not present

## 2016-12-15 DIAGNOSIS — M546 Pain in thoracic spine: Secondary | ICD-10-CM | POA: Diagnosis not present

## 2016-12-15 DIAGNOSIS — M9903 Segmental and somatic dysfunction of lumbar region: Secondary | ICD-10-CM | POA: Diagnosis not present

## 2016-12-15 DIAGNOSIS — M9902 Segmental and somatic dysfunction of thoracic region: Secondary | ICD-10-CM | POA: Diagnosis not present

## 2016-12-18 DIAGNOSIS — M546 Pain in thoracic spine: Secondary | ICD-10-CM | POA: Diagnosis not present

## 2016-12-18 DIAGNOSIS — M9903 Segmental and somatic dysfunction of lumbar region: Secondary | ICD-10-CM | POA: Diagnosis not present

## 2016-12-18 DIAGNOSIS — M9902 Segmental and somatic dysfunction of thoracic region: Secondary | ICD-10-CM | POA: Diagnosis not present

## 2016-12-18 DIAGNOSIS — M545 Low back pain: Secondary | ICD-10-CM | POA: Diagnosis not present

## 2017-01-01 ENCOUNTER — Ambulatory Visit: Payer: Self-pay | Admitting: Neurology

## 2017-03-15 DIAGNOSIS — K219 Gastro-esophageal reflux disease without esophagitis: Secondary | ICD-10-CM | POA: Diagnosis not present

## 2017-03-15 DIAGNOSIS — Z23 Encounter for immunization: Secondary | ICD-10-CM | POA: Diagnosis not present

## 2017-03-15 DIAGNOSIS — I10 Essential (primary) hypertension: Secondary | ICD-10-CM | POA: Diagnosis not present

## 2017-05-14 DIAGNOSIS — E210 Primary hyperparathyroidism: Secondary | ICD-10-CM | POA: Diagnosis not present

## 2017-05-14 DIAGNOSIS — E559 Vitamin D deficiency, unspecified: Secondary | ICD-10-CM | POA: Diagnosis not present

## 2017-05-14 DIAGNOSIS — I10 Essential (primary) hypertension: Secondary | ICD-10-CM | POA: Diagnosis not present

## 2017-05-28 DIAGNOSIS — Z780 Asymptomatic menopausal state: Secondary | ICD-10-CM | POA: Diagnosis not present

## 2017-09-17 DIAGNOSIS — Z6833 Body mass index (BMI) 33.0-33.9, adult: Secondary | ICD-10-CM | POA: Diagnosis not present

## 2017-09-17 DIAGNOSIS — E669 Obesity, unspecified: Secondary | ICD-10-CM | POA: Diagnosis not present

## 2017-09-17 DIAGNOSIS — K219 Gastro-esophageal reflux disease without esophagitis: Secondary | ICD-10-CM | POA: Diagnosis not present

## 2017-09-17 DIAGNOSIS — E7800 Pure hypercholesterolemia, unspecified: Secondary | ICD-10-CM | POA: Diagnosis not present

## 2017-09-17 DIAGNOSIS — I10 Essential (primary) hypertension: Secondary | ICD-10-CM | POA: Diagnosis not present

## 2017-12-05 ENCOUNTER — Other Ambulatory Visit: Payer: Self-pay | Admitting: Family Medicine

## 2017-12-05 DIAGNOSIS — Z1231 Encounter for screening mammogram for malignant neoplasm of breast: Secondary | ICD-10-CM

## 2018-01-01 ENCOUNTER — Ambulatory Visit
Admission: RE | Admit: 2018-01-01 | Discharge: 2018-01-01 | Disposition: A | Discharge status: Home / Self Care | Payer: Medicare HMO | Source: Ambulatory Visit | Attending: Family Medicine | Admitting: Family Medicine

## 2018-01-01 DIAGNOSIS — Z1231 Encounter for screening mammogram for malignant neoplasm of breast: Secondary | ICD-10-CM

## 2018-04-02 DIAGNOSIS — N814 Uterovaginal prolapse, unspecified: Secondary | ICD-10-CM | POA: Diagnosis not present

## 2018-04-02 DIAGNOSIS — Z1389 Encounter for screening for other disorder: Secondary | ICD-10-CM | POA: Diagnosis not present

## 2018-04-02 DIAGNOSIS — E7800 Pure hypercholesterolemia, unspecified: Secondary | ICD-10-CM | POA: Diagnosis not present

## 2018-04-02 DIAGNOSIS — I10 Essential (primary) hypertension: Secondary | ICD-10-CM | POA: Diagnosis not present

## 2018-04-02 DIAGNOSIS — Z23 Encounter for immunization: Secondary | ICD-10-CM | POA: Diagnosis not present

## 2018-04-02 DIAGNOSIS — K219 Gastro-esophageal reflux disease without esophagitis: Secondary | ICD-10-CM | POA: Diagnosis not present

## 2018-04-02 DIAGNOSIS — E669 Obesity, unspecified: Secondary | ICD-10-CM | POA: Diagnosis not present

## 2018-04-02 DIAGNOSIS — Z0000 Encounter for general adult medical examination without abnormal findings: Secondary | ICD-10-CM | POA: Diagnosis not present

## 2018-04-02 DIAGNOSIS — Z6835 Body mass index (BMI) 35.0-35.9, adult: Secondary | ICD-10-CM | POA: Diagnosis not present

## 2018-06-10 DIAGNOSIS — H52209 Unspecified astigmatism, unspecified eye: Secondary | ICD-10-CM | POA: Diagnosis not present

## 2018-06-10 DIAGNOSIS — H5203 Hypermetropia, bilateral: Secondary | ICD-10-CM | POA: Diagnosis not present

## 2018-06-10 DIAGNOSIS — H524 Presbyopia: Secondary | ICD-10-CM | POA: Diagnosis not present

## 2018-07-03 DIAGNOSIS — R82998 Other abnormal findings in urine: Secondary | ICD-10-CM | POA: Diagnosis not present

## 2018-07-03 DIAGNOSIS — N814 Uterovaginal prolapse, unspecified: Secondary | ICD-10-CM | POA: Diagnosis not present

## 2018-07-03 DIAGNOSIS — N952 Postmenopausal atrophic vaginitis: Secondary | ICD-10-CM | POA: Diagnosis not present

## 2018-08-14 DIAGNOSIS — Z4689 Encounter for fitting and adjustment of other specified devices: Secondary | ICD-10-CM | POA: Diagnosis not present

## 2018-08-14 DIAGNOSIS — N8111 Cystocele, midline: Secondary | ICD-10-CM | POA: Diagnosis not present

## 2018-09-18 DIAGNOSIS — Z960 Presence of urogenital implants: Secondary | ICD-10-CM | POA: Diagnosis not present

## 2018-09-18 DIAGNOSIS — N8111 Cystocele, midline: Secondary | ICD-10-CM | POA: Diagnosis not present

## 2018-09-30 DIAGNOSIS — K5900 Constipation, unspecified: Secondary | ICD-10-CM | POA: Diagnosis not present

## 2018-09-30 DIAGNOSIS — K219 Gastro-esophageal reflux disease without esophagitis: Secondary | ICD-10-CM | POA: Diagnosis not present

## 2018-09-30 DIAGNOSIS — I10 Essential (primary) hypertension: Secondary | ICD-10-CM | POA: Diagnosis not present

## 2019-02-04 ENCOUNTER — Other Ambulatory Visit: Payer: Self-pay | Admitting: Family Medicine

## 2019-02-04 DIAGNOSIS — Z1231 Encounter for screening mammogram for malignant neoplasm of breast: Secondary | ICD-10-CM

## 2019-03-19 ENCOUNTER — Ambulatory Visit
Admission: RE | Admit: 2019-03-19 | Discharge: 2019-03-19 | Disposition: A | Discharge status: Home / Self Care | Payer: Medicare HMO | Source: Ambulatory Visit | Attending: Family Medicine | Admitting: Family Medicine

## 2019-03-19 ENCOUNTER — Other Ambulatory Visit: Payer: Self-pay

## 2019-03-19 DIAGNOSIS — Z1231 Encounter for screening mammogram for malignant neoplasm of breast: Secondary | ICD-10-CM | POA: Diagnosis not present

## 2019-04-29 DIAGNOSIS — K219 Gastro-esophageal reflux disease without esophagitis: Secondary | ICD-10-CM | POA: Diagnosis not present

## 2019-04-29 DIAGNOSIS — E669 Obesity, unspecified: Secondary | ICD-10-CM | POA: Diagnosis not present

## 2019-04-29 DIAGNOSIS — I10 Essential (primary) hypertension: Secondary | ICD-10-CM | POA: Diagnosis not present

## 2019-04-29 DIAGNOSIS — L304 Erythema intertrigo: Secondary | ICD-10-CM | POA: Diagnosis not present

## 2019-04-29 DIAGNOSIS — Z0000 Encounter for general adult medical examination without abnormal findings: Secondary | ICD-10-CM | POA: Diagnosis not present

## 2019-04-29 DIAGNOSIS — Z6833 Body mass index (BMI) 33.0-33.9, adult: Secondary | ICD-10-CM | POA: Diagnosis not present

## 2019-04-29 DIAGNOSIS — E7800 Pure hypercholesterolemia, unspecified: Secondary | ICD-10-CM | POA: Diagnosis not present

## 2019-04-29 DIAGNOSIS — Z1389 Encounter for screening for other disorder: Secondary | ICD-10-CM | POA: Diagnosis not present

## 2019-04-29 DIAGNOSIS — Z23 Encounter for immunization: Secondary | ICD-10-CM | POA: Diagnosis not present

## 2019-05-13 DIAGNOSIS — M7731 Calcaneal spur, right foot: Secondary | ICD-10-CM | POA: Diagnosis not present

## 2019-05-13 DIAGNOSIS — M7661 Achilles tendinitis, right leg: Secondary | ICD-10-CM | POA: Diagnosis not present

## 2019-05-13 DIAGNOSIS — M79671 Pain in right foot: Secondary | ICD-10-CM | POA: Diagnosis not present

## 2019-05-13 DIAGNOSIS — M722 Plantar fascial fibromatosis: Secondary | ICD-10-CM | POA: Diagnosis not present

## 2019-05-22 DIAGNOSIS — Z20828 Contact with and (suspected) exposure to other viral communicable diseases: Secondary | ICD-10-CM | POA: Diagnosis not present

## 2019-05-29 DIAGNOSIS — M722 Plantar fascial fibromatosis: Secondary | ICD-10-CM | POA: Diagnosis not present

## 2019-05-29 DIAGNOSIS — M71571 Other bursitis, not elsewhere classified, right ankle and foot: Secondary | ICD-10-CM | POA: Diagnosis not present

## 2019-06-03 DIAGNOSIS — Z20828 Contact with and (suspected) exposure to other viral communicable diseases: Secondary | ICD-10-CM | POA: Diagnosis not present

## 2019-06-12 DIAGNOSIS — H524 Presbyopia: Secondary | ICD-10-CM | POA: Diagnosis not present

## 2019-08-10 ENCOUNTER — Ambulatory Visit: Payer: Medicare Other | Attending: Internal Medicine

## 2019-08-10 DIAGNOSIS — Z23 Encounter for immunization: Secondary | ICD-10-CM | POA: Insufficient documentation

## 2019-08-10 NOTE — Progress Notes (Signed)
   Covid-19 Vaccination Clinic  Name:  Teresa Maddox    MRN: [id number] DOB: [birth year]  08/10/2019  [name] was observed post Covid-19 immunization for 15 minutes without incidence. She was provided with Vaccine Information Sheet and instruction to access the V-Safe system.   [name] was instructed to call 911 with any severe reactions post vaccine: Marland Kitchen Difficulty breathing  . Swelling of your face and throat  . A fast heartbeat  . A bad rash all over your body  . Dizziness and weakness    Immunizations Administered    Name Date Dose VIS Date Route   Pfizer COVID-19 Vaccine 08/10/2019  9:56 AM 0.3 mL 05/23/2019 Intramuscular   Manufacturer: [place]   Lot: [id number]   [place]: [id number]

## 2019-09-01 ENCOUNTER — Ambulatory Visit: Payer: Medicare Other | Attending: Internal Medicine

## 2019-09-01 DIAGNOSIS — Z23 Encounter for immunization: Secondary | ICD-10-CM

## 2019-09-01 NOTE — Progress Notes (Signed)
   Covid-19 Vaccination Clinic  Name:  Teresa Maddox    MRN: [id number] DOB: 08-Mar-1946  09/01/2019  [name] was observed post Covid-19 immunization for 15 minutes without incident. She was provided with Vaccine Information Sheet and instruction to access the V-Safe system.   [name] was instructed to call 911 with any severe reactions post vaccine: Marland Kitchen Difficulty breathing  . Swelling of face and throat  . A fast heartbeat  . A bad rash all over body  . Dizziness and weakness   Immunizations Administered    Name Date Dose VIS Date Route   Pfizer COVID-19 Vaccine 09/01/2019 10:20 AM 0.3 mL 05/23/2019 Intramuscular   Manufacturer: [employer]   Lot: [id number]   [place]: [id number]

## 2020-03-24 ENCOUNTER — Other Ambulatory Visit: Payer: Self-pay | Admitting: Family Medicine

## 2020-03-24 DIAGNOSIS — I872 Venous insufficiency (chronic) (peripheral): Secondary | ICD-10-CM

## 2020-03-30 ENCOUNTER — Ambulatory Visit
Admission: RE | Admit: 2020-03-30 | Discharge: 2020-03-30 | Disposition: A | Discharge status: Home / Self Care | Payer: Medicare Other | Source: Ambulatory Visit | Attending: Family Medicine | Admitting: Family Medicine

## 2020-03-30 DIAGNOSIS — I872 Venous insufficiency (chronic) (peripheral): Secondary | ICD-10-CM

## 2020-03-30 NOTE — Consult Note (Signed)
Chief Complaint:  Right lower extremity edema  Referring Physician(s): Smith,Candace   History of Present Illness: Teresa Maddox is a 74 y.o. female who is experiencing asymmetric right lower extremity edema affecting the calf, ankle and foot.  Review of her venous history performed.  No prior treatments for varicose veins or spider veins.  No prior vein removal, injection or laser treatment.  No history of ulceration, DVT, or other significant family history.  She does not wear prescription or over-the-counter compression stockings.  She is employed and works at home and does sit for the majority of the day at a [employer], nature.  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  Elevation seems to help with the symptoms.  She currently does not take any pain medications.  She does have diabetes.  Left leg is not affected.  Past Medical History:  Diagnosis Date  . GERD (gastroesophageal reflux disease)   . HLD (hyperlipidemia)   . Hypertension   . Stroke [facility])     No past surgical history on file.  Allergies: Clindamycin/lincomycin  Medications: Prior to Admission medications   Medication Sig Start Date End Date Taking? Authorizing Provider  aspirin EC 81 MG tablet Take 81 mg by mouth daily.    [provider]  atorvastatin (LIPITOR) 20 MG tablet Take 1 tablet (20 mg total) by mouth daily at 6 PM. 02/26/16   Tat, Shanon Brow, MD  cholecalciferol (VITAMIN D) 1000 units tablet Take 1,000 Units by mouth daily.    [provider]  lisinopril (PRINIVIL,ZESTRIL) 20 MG tablet Take 1 tablet (20 mg total) by mouth daily. 02/26/16   Orson Eva, MD  omeprazole (PRILOSEC) 20 MG capsule Take 1 capsule (20 mg total) by mouth daily. 02/26/16   Orson Eva, MD  vitamin C (ASCORBIC ACID) 500 MG tablet Take 500 mg by mouth daily.    [provider]     Family History  Problem Relation Age of Onset  . CAD Mother      Social History   Socioeconomic History  . Marital status: Divorced    Spouse name: Not on file  . Number of children: Not on file  . Years of education: Not on file  . Highest education level: Not on file  Occupational History  . Not on file  Tobacco Use  . Smoking status: Never Smoker  . Smokeless tobacco: Never Used  Substance and Sexual Activity  . Alcohol use: No  . Drug use: No  . Sexual activity: Not on file  Other Topics Concern  . Not on file  Social History Narrative  . Not on file   Social Determinants of Health   Financial Resource Strain:   . Difficulty of Paying Living Expenses: Not on file  Food Insecurity:   . Worried About Charity fundraiser in the Last Year: Not on file  . Ran Out of Food in the Last Year: Not on file  Transportation Needs:   . Lack of Transportation (Medical): Not on file  . Lack of Transportation (Non-Medical): Not on file  Physical Activity:   . Days of Exercise per Week: Not on file  . Minutes of Exercise per Session: Not on file  Stress:   . Feeling of Stress : Not on file  Social Connections:   . Frequency of Communication with Friends and Family: Not on file  . Frequency of Social Gatherings with Friends  and Family: Not on file  . Attends Religious Services: Not on file  . Active Member of Clubs or Organizations: Not on file  . Attends Archivist Meetings: Not on file  . Marital Status: Not on file    Review of Systems: A 12 point ROS discussed and pertinent positives are indicated in the HPI above.  All other systems are negative.  Review of Systems  Vital Signs: There were no vitals taken for this visit.  Physical Exam Constitutional:      General: She is not in acute distress.    Appearance: She is not toxic-appearing.     Comments: Mildly obese body habitus  Eyes:     General: No scleral icterus.    Conjunctiva/sclera: Conjunctivae normal.  Musculoskeletal:        General: Swelling and  tenderness present. No deformity. Normal range of motion.     Right lower leg: Edema present.     Comments: Sock distribution right lower extremity calf, ankle and foot edema.  Intact skin.  No skin ulcerations.  Normal pedal pulses.  No varicosities.  Skin:    General: Skin is warm and dry.     Findings: No bruising or erythema.  Neurological:     General: No focal deficit present.     Mental Status: Mental status is at baseline.  Psychiatric:        Mood and Affect: Mood normal.        Thought Content: Thought content normal.     Imaging: US Venous Img Lower Unilateral Right (DVT)  Result Date: 03/30/2020 CLINICAL DATA:  Right lower extremity calf, ankle and foot asymmetric edema EXAM: BILATERAL LOWER EXTREMITY VENOUS DOPPLER ULTRASOUND TECHNIQUE: Gray-scale sonography with graded compression, as well as color Doppler and duplex ultrasound were performed to evaluate the lower extremity deep venous systems from the level of the common femoral vein and including the common femoral, femoral, profunda femoral, popliteal and calf veins including the posterior tibial, peroneal and gastrocnemius veins when visible. The superficial great saphenous vein was also interrogated. Spectral Doppler was utilized to evaluate flow at rest and with distal augmentation maneuvers in the common femoral, femoral and popliteal veins. COMPARISON:  None. FINDINGS: RIGHT LOWER EXTREMITY Common Femoral Vein: No evidence of thrombus. Normal compressibility, respiratory phasicity and response to augmentation. Saphenofemoral Junction: No evidence of thrombus. Normal compressibility and flow on color Doppler imaging. Negative for venous insufficiency or reflux. Profunda Femoral Vein: No evidence of thrombus. Normal compressibility and flow on color Doppler imaging. Femoral Vein: No evidence of thrombus. Normal compressibility, respiratory phasicity and response to augmentation. Popliteal Vein: No evidence of thrombus. Normal  compressibility, respiratory phasicity and response to augmentation. Calf Veins: No evidence of thrombus. Normal compressibility and flow on color Doppler imaging. Superficial Great Saphenous Vein: No evidence of thrombus. Normal compressibility. Negative for venous insufficiency or reflux. No sub surface a branching varicosities. Small saphenous vein: No evidence of thrombus. Normal compressibility. Negative for reflux or dilatation. Venous Reflux:  Negative Other Findings:  Peripheral calf edema noted LEFT LOWER EXTREMITY Common Femoral Vein: No evidence of thrombus. Normal compressibility, respiratory phasicity and response to augmentation. Saphenofemoral Junction: No evidence of thrombus. Normal compressibility and flow on color Doppler imaging. Negative for venous insufficiency or reflux Profunda Femoral Vein: No evidence of thrombus. Normal compressibility and flow on color Doppler imaging. Femoral Vein: No evidence of thrombus. Normal compressibility, respiratory phasicity and response to augmentation. Popliteal Vein: No evidence of thrombus. Normal compressibility, respiratory  phasicity and response to augmentation. Calf Veins: No evidence of thrombus. Normal compressibility and flow on color Doppler imaging. Superficial Great Saphenous Vein: No evidence of thrombus. Normal compressibility. Negative for venous insufficiency or reflux. No sub surface varicosities. Small saphenous vein: Normal caliber. Negative for thrombus. Normal compressibility. No reflux. Venous Reflux:  None. IMPRESSION: Negative for saphenous venous insufficiency or reflux bilaterally. No sub surface varicosities. Negative for significant DVT in either extremity. Electronically Signed   By: Jerilynn Mages.  Taiwana Willison M.D.   On: 03/30/2020 13:52   [place] RAD EVAL AND MGMT  Result Date: 03/30/2020 Please refer to "Notes" to see consult details.   Labs:  CBC: No results for input(s): WBC, HGB, HCT, PLT in the last 8760 hours.  COAGS: No results  for input(s): INR, APTT in the last 8760 hours.  BMP: No results for input(s): NA, K, CL, CO2, GLUCOSE, BUN, CALCIUM, CREATININE, GFRNONAA, GFRAA in the last 8760 hours.  Invalid input(s): CMP  LIVER FUNCTION TESTS: No results for input(s): BILITOT, AST, ALT, ALKPHOS, PROT, ALBUMIN in the last 8760 hours.  Assessment and Plan:  Nonspecific asymmetric sock distribution right lower extremity peripheral edema affecting the calf, ankle and foot.  Ultrasound is negative for DVT or any significant venous insufficiency.  No subsurface varicosities.  Therefore, she does not need any vein laser treatment, sclerotherapy, vein removal, or injections.  Plan: Recommend conservative management plan with knee-high prescription strength compression stockings, daily walking, routine exercise, and while she is working intermittently standing and routine calf flexes.  Follow-up as needed for any developing varicosities..  Thank you for this interesting consult.  I greatly enjoyed meeting Teresa Maddox and look forward to participating in their care.  A copy of this report was sent to the requesting provider on this date.  Electronically Signed: Greggory Keen, MD 03/30/2020, 2:34 PM   I spent a total of  40 Minutes   in face to face in clinical consultation, greater than 50% of which was counseling/coordinating care for this patient.

## 2020-04-15 ENCOUNTER — Other Ambulatory Visit: Payer: Self-pay | Admitting: Family Medicine

## 2020-04-15 DIAGNOSIS — Z1231 Encounter for screening mammogram for malignant neoplasm of breast: Secondary | ICD-10-CM

## 2020-05-13 ENCOUNTER — Other Ambulatory Visit: Payer: Self-pay

## 2020-05-13 ENCOUNTER — Ambulatory Visit
Admission: RE | Admit: 2020-05-13 | Discharge: 2020-05-13 | Disposition: A | Discharge status: Home / Self Care | Payer: Medicare Other | Source: Ambulatory Visit | Attending: Family Medicine | Admitting: Family Medicine

## 2020-05-13 DIAGNOSIS — Z1231 Encounter for screening mammogram for malignant neoplasm of breast: Secondary | ICD-10-CM

## 2020-10-28 DIAGNOSIS — R7309 Other abnormal glucose: Secondary | ICD-10-CM | POA: Diagnosis not present

## 2020-10-28 DIAGNOSIS — Z23 Encounter for immunization: Secondary | ICD-10-CM | POA: Diagnosis not present

## 2020-10-28 DIAGNOSIS — K219 Gastro-esophageal reflux disease without esophagitis: Secondary | ICD-10-CM | POA: Diagnosis not present

## 2020-10-28 DIAGNOSIS — R7303 Prediabetes: Secondary | ICD-10-CM | POA: Diagnosis not present

## 2020-10-28 DIAGNOSIS — E7800 Pure hypercholesterolemia, unspecified: Secondary | ICD-10-CM | POA: Diagnosis not present

## 2020-10-28 DIAGNOSIS — I10 Essential (primary) hypertension: Secondary | ICD-10-CM | POA: Diagnosis not present

## 2020-10-28 DIAGNOSIS — M545 Low back pain, unspecified: Secondary | ICD-10-CM | POA: Diagnosis not present

## 2021-06-08 DIAGNOSIS — I10 Essential (primary) hypertension: Secondary | ICD-10-CM | POA: Diagnosis not present

## 2021-06-08 DIAGNOSIS — Z1211 Encounter for screening for malignant neoplasm of colon: Secondary | ICD-10-CM | POA: Diagnosis not present

## 2021-06-08 DIAGNOSIS — E7800 Pure hypercholesterolemia, unspecified: Secondary | ICD-10-CM | POA: Diagnosis not present

## 2021-06-08 DIAGNOSIS — Z1389 Encounter for screening for other disorder: Secondary | ICD-10-CM | POA: Diagnosis not present

## 2021-06-08 DIAGNOSIS — Z0000 Encounter for general adult medical examination without abnormal findings: Secondary | ICD-10-CM | POA: Diagnosis not present

## 2021-06-08 DIAGNOSIS — K219 Gastro-esophageal reflux disease without esophagitis: Secondary | ICD-10-CM | POA: Diagnosis not present

## 2021-06-08 DIAGNOSIS — R7303 Prediabetes: Secondary | ICD-10-CM | POA: Diagnosis not present

## 2021-07-15 DIAGNOSIS — Z0100 Encounter for examination of eyes and vision without abnormal findings: Secondary | ICD-10-CM | POA: Diagnosis not present

## 2021-07-15 DIAGNOSIS — H52223 Regular astigmatism, bilateral: Secondary | ICD-10-CM | POA: Diagnosis not present

## 2021-07-26 ENCOUNTER — Other Ambulatory Visit: Payer: Self-pay | Admitting: Family Medicine

## 2021-07-26 DIAGNOSIS — Z1231 Encounter for screening mammogram for malignant neoplasm of breast: Secondary | ICD-10-CM

## 2021-08-12 ENCOUNTER — Ambulatory Visit: Payer: Medicare Other

## 2021-08-12 ENCOUNTER — Ambulatory Visit
Admission: RE | Admit: 2021-08-12 | Discharge: 2021-08-12 | Disposition: A | Discharge status: Home / Self Care | Payer: No Typology Code available for payment source | Source: Ambulatory Visit | Attending: Family Medicine | Admitting: Family Medicine

## 2021-08-12 ENCOUNTER — Other Ambulatory Visit: Payer: Self-pay

## 2021-08-12 DIAGNOSIS — Z1231 Encounter for screening mammogram for malignant neoplasm of breast: Secondary | ICD-10-CM | POA: Diagnosis not present

## 2021-10-26 DIAGNOSIS — Z86010 Personal history of colonic polyps: Secondary | ICD-10-CM | POA: Diagnosis not present

## 2021-10-26 DIAGNOSIS — K648 Other hemorrhoids: Secondary | ICD-10-CM | POA: Diagnosis not present

## 2021-10-26 DIAGNOSIS — K5730 Diverticulosis of large intestine without perforation or abscess without bleeding: Secondary | ICD-10-CM | POA: Diagnosis not present

## 2021-10-26 DIAGNOSIS — K635 Polyp of colon: Secondary | ICD-10-CM | POA: Diagnosis not present

## 2021-10-26 DIAGNOSIS — Z09 Encounter for follow-up examination after completed treatment for conditions other than malignant neoplasm: Secondary | ICD-10-CM | POA: Diagnosis not present

## 2021-10-26 DIAGNOSIS — D175 Benign lipomatous neoplasm of intra-abdominal organs: Secondary | ICD-10-CM | POA: Diagnosis not present

## 2021-10-28 DIAGNOSIS — K635 Polyp of colon: Secondary | ICD-10-CM | POA: Diagnosis not present

## 2021-12-05 DIAGNOSIS — D473 Essential (hemorrhagic) thrombocythemia: Secondary | ICD-10-CM | POA: Diagnosis not present

## 2021-12-05 DIAGNOSIS — E7800 Pure hypercholesterolemia, unspecified: Secondary | ICD-10-CM | POA: Diagnosis not present

## 2021-12-05 DIAGNOSIS — R7989 Other specified abnormal findings of blood chemistry: Secondary | ICD-10-CM | POA: Diagnosis not present

## 2021-12-05 DIAGNOSIS — K219 Gastro-esophageal reflux disease without esophagitis: Secondary | ICD-10-CM | POA: Diagnosis not present

## 2021-12-05 DIAGNOSIS — R7303 Prediabetes: Secondary | ICD-10-CM | POA: Diagnosis not present

## 2021-12-05 DIAGNOSIS — I10 Essential (primary) hypertension: Secondary | ICD-10-CM | POA: Diagnosis not present

## 2022-01-10 DIAGNOSIS — K219 Gastro-esophageal reflux disease without esophagitis: Secondary | ICD-10-CM | POA: Diagnosis not present

## 2022-01-10 DIAGNOSIS — E7800 Pure hypercholesterolemia, unspecified: Secondary | ICD-10-CM | POA: Diagnosis not present

## 2022-01-10 DIAGNOSIS — I10 Essential (primary) hypertension: Secondary | ICD-10-CM | POA: Diagnosis not present

## 2022-01-10 DIAGNOSIS — R7303 Prediabetes: Secondary | ICD-10-CM | POA: Diagnosis not present

## 2022-02-17 DIAGNOSIS — E669 Obesity, unspecified: Secondary | ICD-10-CM | POA: Diagnosis not present

## 2022-02-17 DIAGNOSIS — Z6833 Body mass index (BMI) 33.0-33.9, adult: Secondary | ICD-10-CM | POA: Diagnosis not present

## 2022-02-17 DIAGNOSIS — I771 Stricture of artery: Secondary | ICD-10-CM | POA: Diagnosis not present

## 2022-02-17 DIAGNOSIS — I700 Atherosclerosis of aorta: Secondary | ICD-10-CM | POA: Diagnosis not present

## 2022-02-17 DIAGNOSIS — I10 Essential (primary) hypertension: Secondary | ICD-10-CM | POA: Diagnosis not present

## 2022-02-17 DIAGNOSIS — K219 Gastro-esophageal reflux disease without esophagitis: Secondary | ICD-10-CM | POA: Diagnosis not present

## 2022-02-17 DIAGNOSIS — Z008 Encounter for other general examination: Secondary | ICD-10-CM | POA: Diagnosis not present

## 2022-06-19 DIAGNOSIS — R7303 Prediabetes: Secondary | ICD-10-CM | POA: Diagnosis not present

## 2022-06-19 DIAGNOSIS — E7800 Pure hypercholesterolemia, unspecified: Secondary | ICD-10-CM | POA: Diagnosis not present

## 2022-06-19 DIAGNOSIS — E669 Obesity, unspecified: Secondary | ICD-10-CM | POA: Diagnosis not present

## 2022-06-19 DIAGNOSIS — R21 Rash and other nonspecific skin eruption: Secondary | ICD-10-CM | POA: Diagnosis not present

## 2022-06-19 DIAGNOSIS — Z0000 Encounter for general adult medical examination without abnormal findings: Secondary | ICD-10-CM | POA: Diagnosis not present

## 2022-06-19 DIAGNOSIS — I10 Essential (primary) hypertension: Secondary | ICD-10-CM | POA: Diagnosis not present

## 2022-06-19 DIAGNOSIS — Z1331 Encounter for screening for depression: Secondary | ICD-10-CM | POA: Diagnosis not present

## 2022-06-19 DIAGNOSIS — K219 Gastro-esophageal reflux disease without esophagitis: Secondary | ICD-10-CM | POA: Diagnosis not present

## 2022-08-18 DIAGNOSIS — H52223 Regular astigmatism, bilateral: Secondary | ICD-10-CM | POA: Diagnosis not present

## 2022-08-18 DIAGNOSIS — Z0100 Encounter for examination of eyes and vision without abnormal findings: Secondary | ICD-10-CM | POA: Diagnosis not present

## 2022-09-15 ENCOUNTER — Other Ambulatory Visit: Payer: Self-pay | Admitting: Family Medicine

## 2022-09-15 DIAGNOSIS — Z1231 Encounter for screening mammogram for malignant neoplasm of breast: Secondary | ICD-10-CM

## 2022-10-10 IMAGING — MG MM DIGITAL SCREENING BILAT W/ TOMO AND CAD
6 of 12 series · 6 of 36 positions shown · non-contrast
Comparison: Previous exam(s).

CLINICAL DATA: Screening.

EXAM:
DIGITAL SCREENING BILATERAL MAMMOGRAM WITH TOMOSYNTHESIS AND CAD
TECHNIQUE: Bilateral screening digital craniocaudal and mediolateral oblique
mammograms were obtained. Bilateral screening digital breast
tomosynthesis was performed. The images were evaluated with
computer-aided detection.

[R MLO synth-2D]
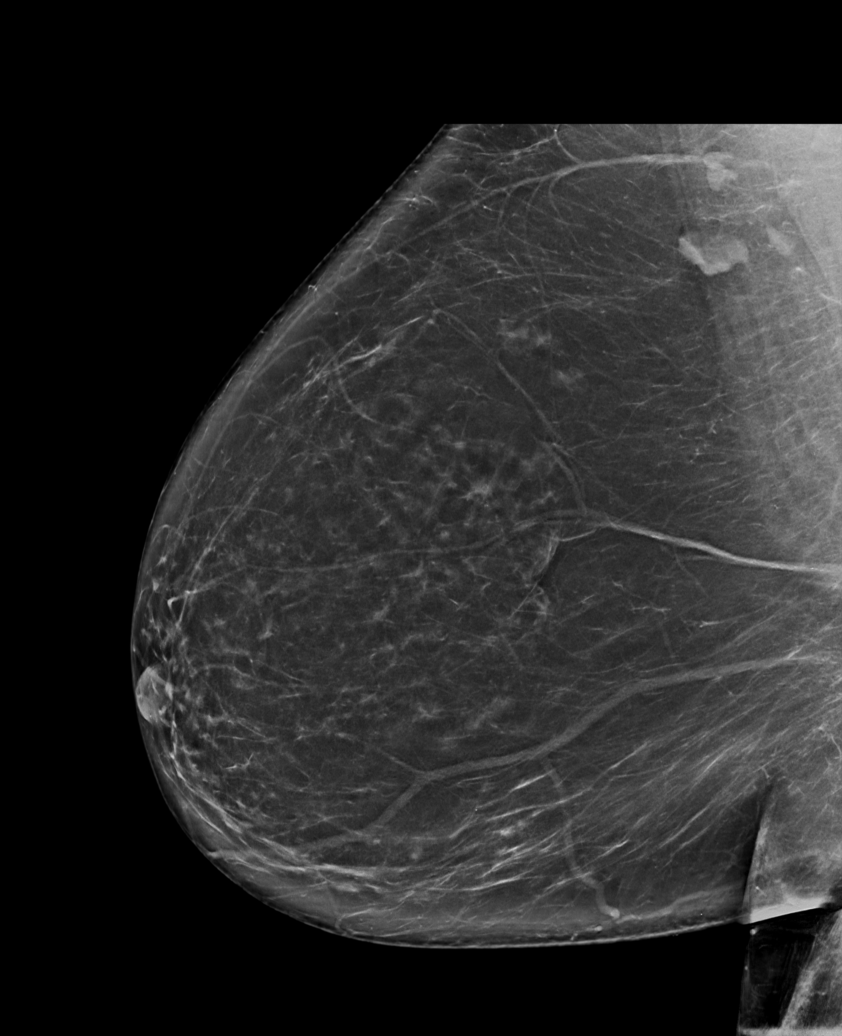

[R CC synth-2D]
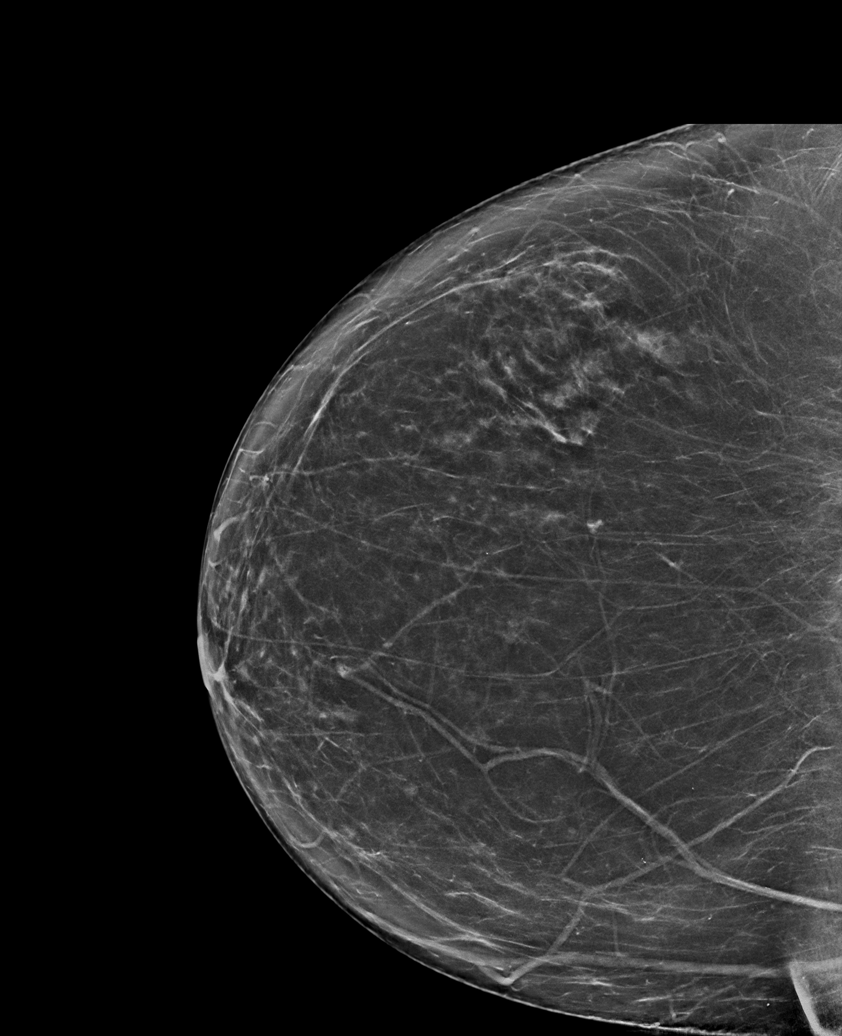

[L MLO synth-2D (1 of 2)]
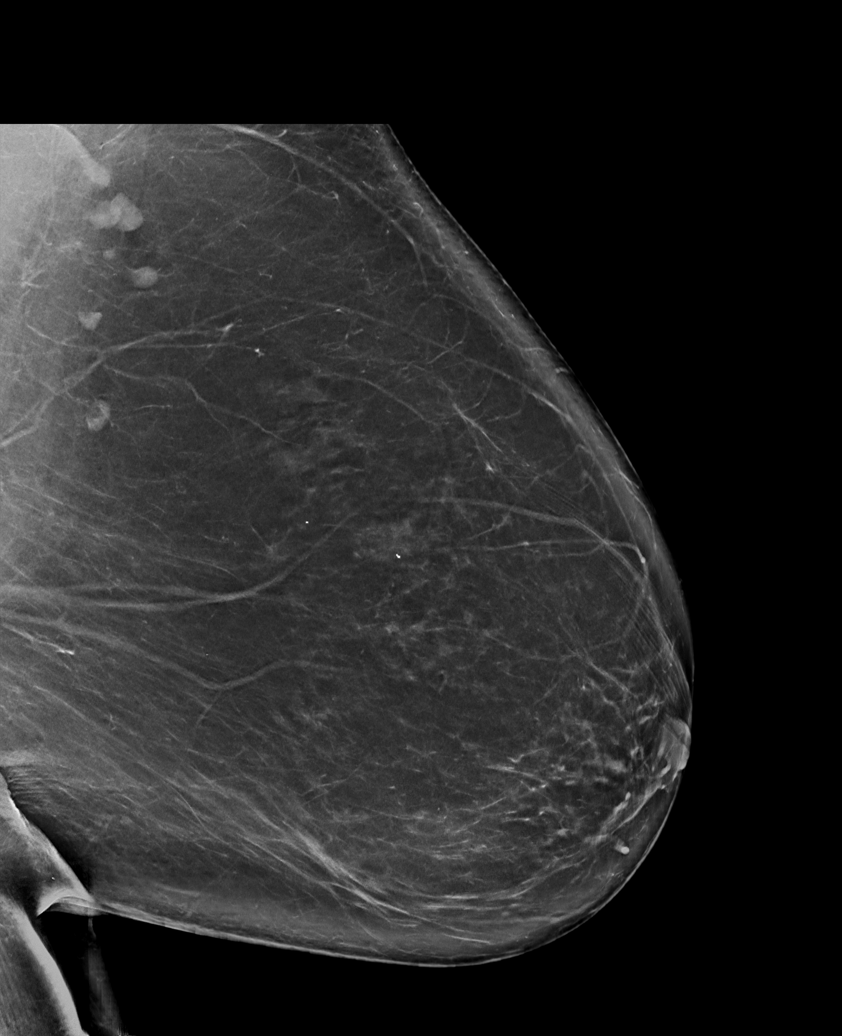

[L MLO synth-2D (2 of 2)]
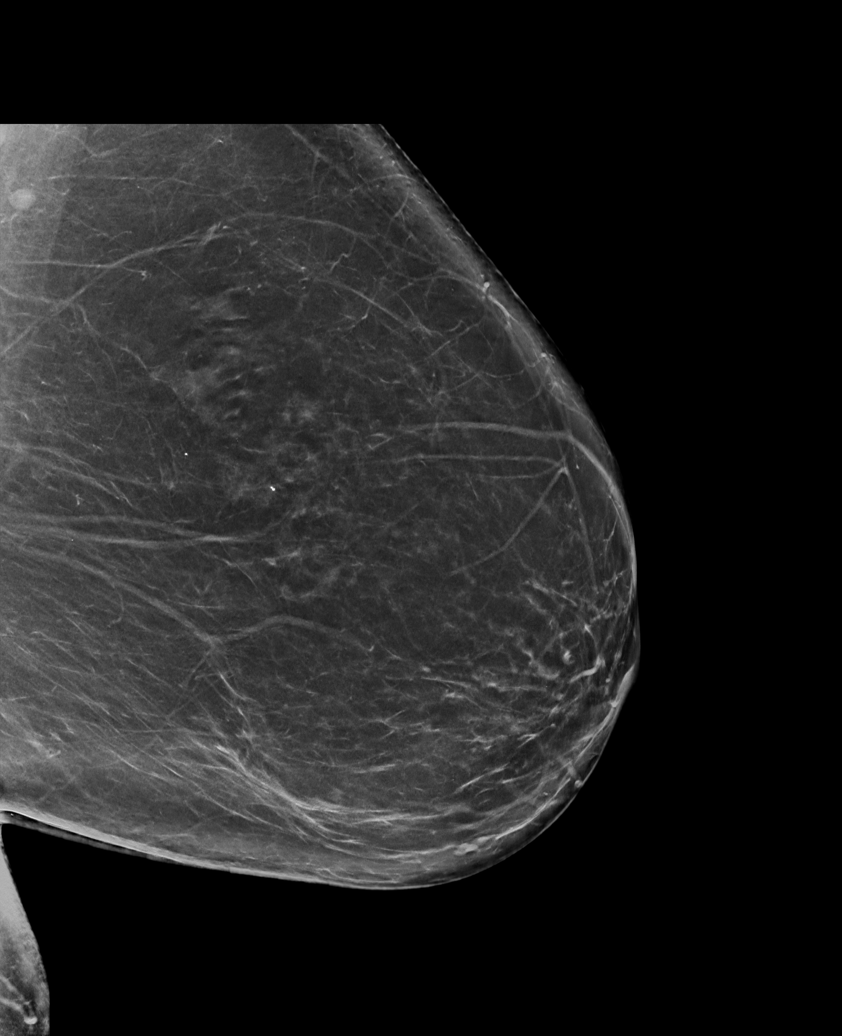

[L CC synth-2D]
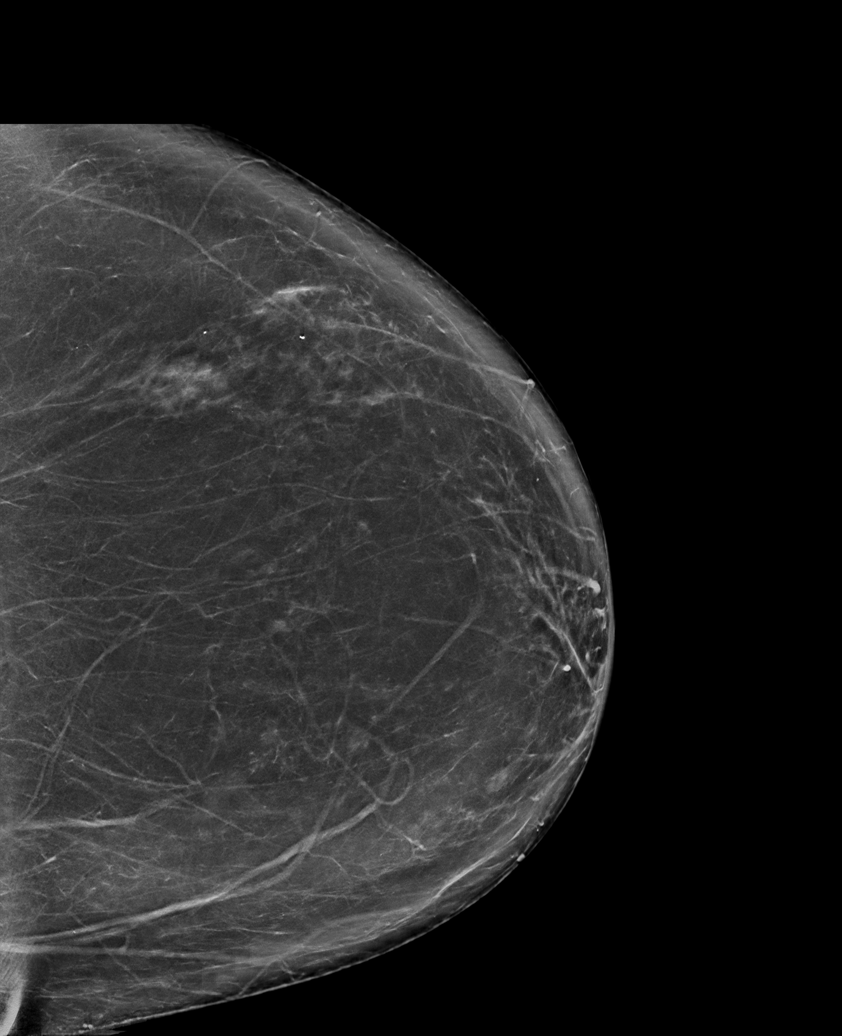

[R CV synth-2D]
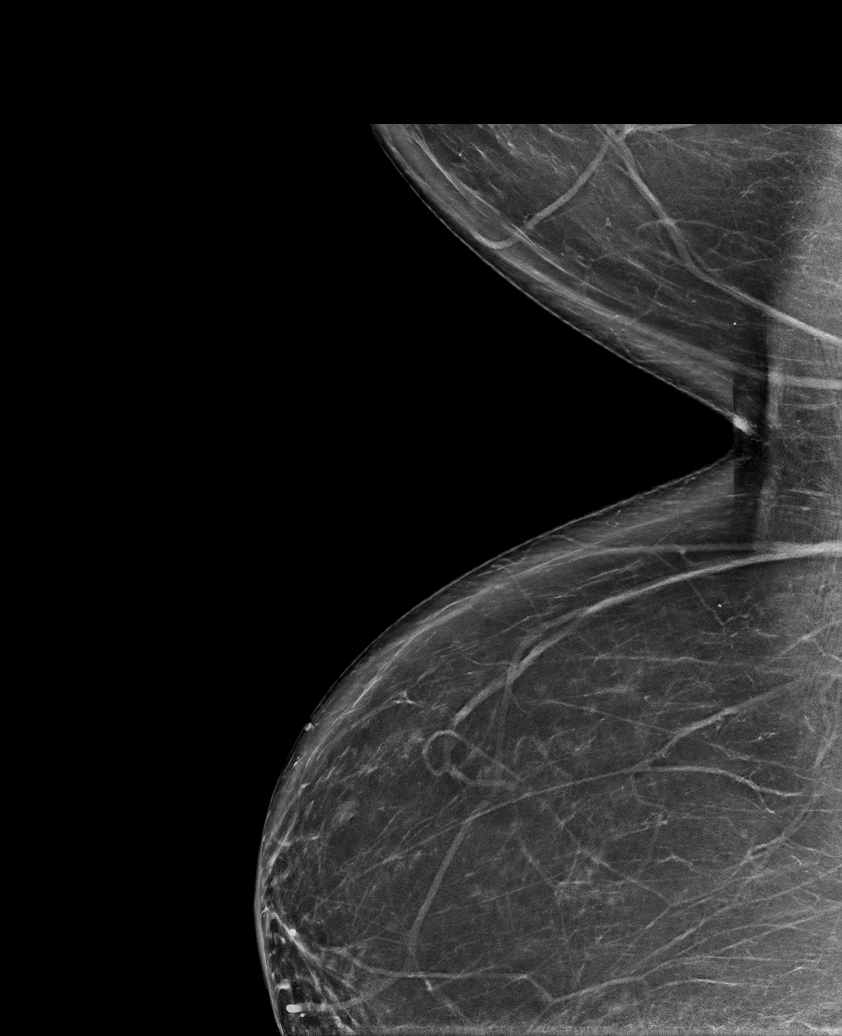

[6 of 36 positions shown; findings below may reference images not displayed]

ACR Breast Density Category b: There are scattered areas of
fibroglandular density.
FINDINGS: There are no findings suspicious for malignancy.
IMPRESSION: No mammographic evidence of malignancy. A result letter of this
screening mammogram will be mailed directly to the patient.

RECOMMENDATION:
Screening mammogram in one year. (Code:51-O-LD2)

BI-RADS CATEGORY  1: Negative.

## 2022-10-27 ENCOUNTER — Ambulatory Visit
Admission: RE | Admit: 2022-10-27 | Discharge: 2022-10-27 | Disposition: A | Discharge status: Home / Self Care | Payer: Medicare HMO | Source: Ambulatory Visit | Attending: Family Medicine | Admitting: Family Medicine

## 2022-10-27 DIAGNOSIS — Z1231 Encounter for screening mammogram for malignant neoplasm of breast: Secondary | ICD-10-CM

## 2022-10-31 ENCOUNTER — Other Ambulatory Visit: Payer: Self-pay | Admitting: Family Medicine

## 2022-10-31 DIAGNOSIS — R928 Other abnormal and inconclusive findings on diagnostic imaging of breast: Secondary | ICD-10-CM

## 2022-11-03 ENCOUNTER — Other Ambulatory Visit: Payer: Medicare HMO

## 2022-11-20 ENCOUNTER — Ambulatory Visit
Admission: RE | Admit: 2022-11-20 | Discharge: 2022-11-20 | Disposition: A | Discharge status: Home / Self Care | Payer: Medicare HMO | Source: Ambulatory Visit | Attending: Family Medicine | Admitting: Family Medicine

## 2022-11-20 ENCOUNTER — Other Ambulatory Visit: Payer: Self-pay | Admitting: Family Medicine

## 2022-11-20 DIAGNOSIS — R922 Inconclusive mammogram: Secondary | ICD-10-CM | POA: Diagnosis not present

## 2022-11-20 DIAGNOSIS — N6320 Unspecified lump in the left breast, unspecified quadrant: Secondary | ICD-10-CM

## 2022-11-20 DIAGNOSIS — R928 Other abnormal and inconclusive findings on diagnostic imaging of breast: Secondary | ICD-10-CM

## 2022-11-20 DIAGNOSIS — N63 Unspecified lump in unspecified breast: Secondary | ICD-10-CM | POA: Diagnosis not present

## 2022-11-24 ENCOUNTER — Ambulatory Visit
Admission: RE | Admit: 2022-11-24 | Discharge: 2022-11-24 | Disposition: A | Discharge status: Home / Self Care | Payer: Medicare HMO | Source: Ambulatory Visit | Attending: Family Medicine | Admitting: Family Medicine

## 2022-11-24 DIAGNOSIS — N6002 Solitary cyst of left breast: Secondary | ICD-10-CM | POA: Diagnosis not present

## 2022-11-24 DIAGNOSIS — R928 Other abnormal and inconclusive findings on diagnostic imaging of breast: Secondary | ICD-10-CM

## 2022-11-24 DIAGNOSIS — N6320 Unspecified lump in the left breast, unspecified quadrant: Secondary | ICD-10-CM

## 2022-12-25 DIAGNOSIS — R7303 Prediabetes: Secondary | ICD-10-CM | POA: Diagnosis not present

## 2022-12-25 DIAGNOSIS — K219 Gastro-esophageal reflux disease without esophagitis: Secondary | ICD-10-CM | POA: Diagnosis not present

## 2022-12-25 DIAGNOSIS — I10 Essential (primary) hypertension: Secondary | ICD-10-CM | POA: Diagnosis not present

## 2022-12-25 DIAGNOSIS — E7800 Pure hypercholesterolemia, unspecified: Secondary | ICD-10-CM | POA: Diagnosis not present

## 2023-01-26 DIAGNOSIS — E669 Obesity, unspecified: Secondary | ICD-10-CM | POA: Diagnosis not present

## 2023-01-26 DIAGNOSIS — Z8249 Family history of ischemic heart disease and other diseases of the circulatory system: Secondary | ICD-10-CM | POA: Diagnosis not present

## 2023-01-26 DIAGNOSIS — E785 Hyperlipidemia, unspecified: Secondary | ICD-10-CM | POA: Diagnosis not present

## 2023-01-26 DIAGNOSIS — K219 Gastro-esophageal reflux disease without esophagitis: Secondary | ICD-10-CM | POA: Diagnosis not present

## 2023-01-26 DIAGNOSIS — Z833 Family history of diabetes mellitus: Secondary | ICD-10-CM | POA: Diagnosis not present

## 2023-01-26 DIAGNOSIS — I872 Venous insufficiency (chronic) (peripheral): Secondary | ICD-10-CM | POA: Diagnosis not present

## 2023-01-26 DIAGNOSIS — I10 Essential (primary) hypertension: Secondary | ICD-10-CM | POA: Diagnosis not present

## 2023-01-26 DIAGNOSIS — Z008 Encounter for other general examination: Secondary | ICD-10-CM | POA: Diagnosis not present

## 2023-01-26 DIAGNOSIS — Z6832 Body mass index (BMI) 32.0-32.9, adult: Secondary | ICD-10-CM | POA: Diagnosis not present

## 2023-02-14 DIAGNOSIS — Z993 Dependence on wheelchair: Secondary | ICD-10-CM | POA: Diagnosis not present

## 2023-02-14 DIAGNOSIS — Z23 Encounter for immunization: Secondary | ICD-10-CM | POA: Diagnosis not present

## 2023-02-14 DIAGNOSIS — D75839 Thrombocytosis, unspecified: Secondary | ICD-10-CM | POA: Diagnosis not present

## 2023-02-14 DIAGNOSIS — R6889 Other general symptoms and signs: Secondary | ICD-10-CM | POA: Diagnosis not present

## 2023-02-14 DIAGNOSIS — M79605 Pain in left leg: Secondary | ICD-10-CM | POA: Diagnosis not present

## 2023-02-16 ENCOUNTER — Other Ambulatory Visit: Payer: Self-pay | Admitting: Family Medicine

## 2023-02-16 DIAGNOSIS — I739 Peripheral vascular disease, unspecified: Secondary | ICD-10-CM

## 2023-02-16 DIAGNOSIS — R6889 Other general symptoms and signs: Secondary | ICD-10-CM

## 2023-02-21 DIAGNOSIS — M2011 Hallux valgus (acquired), right foot: Secondary | ICD-10-CM | POA: Diagnosis not present

## 2023-02-21 DIAGNOSIS — M79671 Pain in right foot: Secondary | ICD-10-CM | POA: Diagnosis not present

## 2023-02-21 DIAGNOSIS — M79672 Pain in left foot: Secondary | ICD-10-CM | POA: Diagnosis not present

## 2023-02-26 ENCOUNTER — Ambulatory Visit
Admission: RE | Admit: 2023-02-26 | Discharge: 2023-02-26 | Disposition: A | Discharge status: Home / Self Care | Payer: Medicare HMO | Source: Ambulatory Visit | Attending: Family Medicine | Admitting: Family Medicine

## 2023-02-26 DIAGNOSIS — I739 Peripheral vascular disease, unspecified: Secondary | ICD-10-CM | POA: Diagnosis not present

## 2023-02-26 DIAGNOSIS — M79605 Pain in left leg: Secondary | ICD-10-CM | POA: Diagnosis not present

## 2023-02-26 DIAGNOSIS — R6889 Other general symptoms and signs: Secondary | ICD-10-CM

## 2023-03-08 ENCOUNTER — Encounter: Payer: Self-pay | Admitting: Vascular Surgery

## 2023-03-08 ENCOUNTER — Ambulatory Visit: Payer: Medicare HMO | Admitting: Vascular Surgery

## 2023-03-08 VITALS — BP 138/85 | HR 79 | Temp 97.9°F | Resp 20 | Ht 66.0 in | Wt 211.0 lb

## 2023-03-08 DIAGNOSIS — I70212 Atherosclerosis of native arteries of extremities with intermittent claudication, left leg: Secondary | ICD-10-CM | POA: Diagnosis not present

## 2023-03-08 NOTE — Progress Notes (Signed)
Patient ID: Teresa Maddox, female   DOB: [birth year], 77 y.o.   MRN: [id number]  Reason for Consult: New Patient (Initial Visit)   Referred by Merri Brunette, MD  Subjective:     HPI:  Teresa Maddox is a 77 y.o. female without history of disease.  She does have hyperlipidemia and hypertension with remote history of stroke.  Recently she was at [facility] and realized that she could not walk very far based on her left lower extremity.  She was having pain from the left knee down.  She did not notice any injuries.  She was able to walk further if she rested.  She does not have tissue loss or ulceration.  She has never had problems like this before but previously was more active.  She denies any personal or family history of aneurysm disease.  She does not take any antiplatelet or blood thinner medications.  Past Medical History:  Diagnosis Date   GERD (gastroesophageal reflux disease)    HLD (hyperlipidemia)    Hypertension    Stroke (HCC)    Family History  Problem Relation Age of Onset   CAD Mother    Breast cancer Neg Hx    Past Surgical History:  Procedure Laterality Date   ABDOMINAL HYSTERECTOMY     KNEE SURGERY      Short Social History:  Social History   Tobacco Use   Smoking status: Never   Smokeless tobacco: Never  Substance Use Topics   Alcohol use: No    Allergies  Allergen Reactions   Clindamycin/Lincomycin Itching    Current Outpatient Medications  Medication Sig Dispense Refill   amLODipine (NORVASC) 5 MG tablet Take 5 mg by mouth daily.     atorvastatin (LIPITOR) 20 MG tablet Take 1 tablet (20 mg total) by mouth daily at 6 PM. 30 tablet 1   cholecalciferol (VITAMIN D) 1000 units tablet Take 1,000 Units by mouth daily.     conjugated estrogens (PREMARIN) vaginal cream Place 1 applicator vaginally daily.     hydrochlorothiazide (HYDRODIURIL) 25 MG tablet 1 (one) time each day at the same time.     lisinopril (PRINIVIL,ZESTRIL) 20 MG tablet Take 1  tablet (20 mg total) by mouth daily. 30 tablet 0   omeprazole (PRILOSEC) 20 MG capsule Take 1 capsule (20 mg total) by mouth daily. 30 capsule 0   vitamin C (ASCORBIC ACID) 500 MG tablet Take 500 mg by mouth daily.     aspirin EC 81 MG tablet Take 81 mg by mouth daily. (Patient not taking: Reported on 03/08/2023)     No current facility-administered medications for this visit.    Review of Systems  Constitutional:  Constitutional negative. HENT: HENT negative.  Eyes: Eyes negative.  Cardiovascular: Positive for claudication.  GI: Gastrointestinal negative.  Musculoskeletal: Positive for leg pain.  Neurological: Neurological negative. Psychiatric: Psychiatric negative.        Objective:  Objective   Vitals:   03/08/23 1126  BP: 138/85  Pulse: 79  Resp: 20  Temp: 97.9 F (36.6 C)  SpO2: 94%  Weight: 211 lb (95.7 kg)  Height: 5\' 6"  (1.676 m)   Body mass index is 34.06 kg/m.  Physical Exam HENT:     Head: Normocephalic.     Nose: Nose normal.  Eyes:     Pupils: Pupils are equal, round, and reactive to light.  Neck:     Vascular: No carotid bruit.  Cardiovascular:     Rate and Rhythm: Normal rate.  Pulses:          Dorsalis pedis pulses are 2+ on the right side and 1+ on the left side.  Pulmonary:     Effort: Pulmonary effort is normal.  Abdominal:     General: Abdomen is flat.     Palpations: Abdomen is soft.  Musculoskeletal:     Cervical back: Neck supple.  Skin:    Capillary Refill: Capillary refill takes less than 2 seconds.  Neurological:     General: No focal deficit present.     Mental Status: She is alert.  Psychiatric:        Mood and Affect: Mood normal.        Thought Content: Thought content normal.        Judgment: Judgment normal.     Data: FINDINGS: Right Lower Extremity   Resting ABI:  1.02   Resting TBI: 0.96   Segmental Pressures: Normal segmental pressures, no significant (20 mmHg) pressure gradient between adjacent  segments.   Great toe pressure: 106 mmHg   Arterial Waveforms: Normal multi-phasic arterial waveforms.   PVRs: Normal PVRs with maintained waveform amplitude, augmentation and quality.   Left Lower Extremity:   Resting ABI: 0.71   Resting TBI: 0.58   Segmental Pressures: Significant decrease in pressure between calf and ankle with a decrease of 62 mm Hg.   Great toe pressure: 64 mmHg   Arterial Waveforms: Posterior tibial and dorsalis pedis artery waveforms are monophasic.   PVRs: Normal PVRs with maintained waveform amplitude, augmentation and quality.   Other: Symmetric upper extremity pressures.   Ankle Brachial index   > 1.4 Non diagnostic secondary to incompressible vessel calcifications   1.0-1.4       Normal   0.9-0.99     Borderline PAD   0.8-0.89     Mild PAD   0.5-0.79     Moderate PAD   < 0.5          Severe PAD   Toe Brachial Index   Normal     >0.65   Moderate  0.53-0.64   Severe     <0.23   Toe Pressures   Absolute toe pressure >11mmHg sufficient for wound healing.   Toe pressures <67mmHg = critical limb ischemia.   IMPRESSION: Findings consistent with significant left lower extremity arterial occlusive disease with segment of stenosis likely below the level of the left knee.     Assessment/Plan:    77 year old female with left lower extremity claudication symptoms which are not life limiting at this time.  I have recommended 81 mg aspirin daily and a dedicated walking program.  She will follow-up in 1 year with lower extremity ABIs and left lower extremity arterial duplex unless she has issues before this and we can certainly see her sooner.  All questions answered today.     Maeola Harman MD [facility]

## 2023-03-20 ENCOUNTER — Other Ambulatory Visit: Payer: Self-pay

## 2023-03-20 DIAGNOSIS — I70212 Atherosclerosis of native arteries of extremities with intermittent claudication, left leg: Secondary | ICD-10-CM

## 2023-07-16 DIAGNOSIS — I739 Peripheral vascular disease, unspecified: Secondary | ICD-10-CM | POA: Diagnosis not present

## 2023-07-16 DIAGNOSIS — Z23 Encounter for immunization: Secondary | ICD-10-CM | POA: Diagnosis not present

## 2023-07-16 DIAGNOSIS — E7800 Pure hypercholesterolemia, unspecified: Secondary | ICD-10-CM | POA: Diagnosis not present

## 2023-07-16 DIAGNOSIS — M79672 Pain in left foot: Secondary | ICD-10-CM | POA: Diagnosis not present

## 2023-07-16 DIAGNOSIS — M79671 Pain in right foot: Secondary | ICD-10-CM | POA: Diagnosis not present

## 2023-07-16 DIAGNOSIS — I10 Essential (primary) hypertension: Secondary | ICD-10-CM | POA: Diagnosis not present

## 2023-07-16 DIAGNOSIS — K219 Gastro-esophageal reflux disease without esophagitis: Secondary | ICD-10-CM | POA: Diagnosis not present

## 2023-07-16 DIAGNOSIS — Z0000 Encounter for general adult medical examination without abnormal findings: Secondary | ICD-10-CM | POA: Diagnosis not present

## 2023-07-16 DIAGNOSIS — Z13820 Encounter for screening for osteoporosis: Secondary | ICD-10-CM | POA: Diagnosis not present

## 2023-07-30 DIAGNOSIS — M8588 Other specified disorders of bone density and structure, other site: Secondary | ICD-10-CM | POA: Diagnosis not present

## 2023-10-19 ENCOUNTER — Other Ambulatory Visit: Payer: Self-pay | Admitting: Family Medicine

## 2023-10-19 DIAGNOSIS — Z1231 Encounter for screening mammogram for malignant neoplasm of breast: Secondary | ICD-10-CM

## 2023-11-01 ENCOUNTER — Ambulatory Visit
Admission: RE | Admit: 2023-11-01 | Discharge: 2023-11-01 | Disposition: A | Discharge status: Home / Self Care | Source: Ambulatory Visit | Attending: Family Medicine | Admitting: Family Medicine

## 2023-11-01 DIAGNOSIS — Z1231 Encounter for screening mammogram for malignant neoplasm of breast: Secondary | ICD-10-CM

## 2023-11-08 ENCOUNTER — Other Ambulatory Visit: Payer: Self-pay | Admitting: Family Medicine

## 2023-11-08 DIAGNOSIS — R928 Other abnormal and inconclusive findings on diagnostic imaging of breast: Secondary | ICD-10-CM

## 2023-11-23 ENCOUNTER — Ambulatory Visit
Admission: RE | Admit: 2023-11-23 | Discharge: 2023-11-23 | Disposition: A | Discharge status: Home / Self Care | Source: Ambulatory Visit | Attending: Family Medicine | Admitting: Family Medicine

## 2023-11-23 ENCOUNTER — Ambulatory Visit

## 2023-11-23 DIAGNOSIS — R928 Other abnormal and inconclusive findings on diagnostic imaging of breast: Secondary | ICD-10-CM

## 2023-12-17 DIAGNOSIS — H25813 Combined forms of age-related cataract, bilateral: Secondary | ICD-10-CM | POA: Diagnosis not present

## 2023-12-27 DIAGNOSIS — H25811 Combined forms of age-related cataract, right eye: Secondary | ICD-10-CM | POA: Diagnosis not present

## 2023-12-27 DIAGNOSIS — I10 Essential (primary) hypertension: Secondary | ICD-10-CM | POA: Diagnosis not present

## 2024-01-10 DIAGNOSIS — H25812 Combined forms of age-related cataract, left eye: Secondary | ICD-10-CM | POA: Diagnosis not present

## 2024-01-10 DIAGNOSIS — I10 Essential (primary) hypertension: Secondary | ICD-10-CM | POA: Diagnosis not present

## 2024-01-14 DIAGNOSIS — I10 Essential (primary) hypertension: Secondary | ICD-10-CM | POA: Diagnosis not present

## 2024-01-14 DIAGNOSIS — M2141 Flat foot [pes planus] (acquired), right foot: Secondary | ICD-10-CM | POA: Diagnosis not present

## 2024-01-14 DIAGNOSIS — M79671 Pain in right foot: Secondary | ICD-10-CM | POA: Diagnosis not present

## 2024-01-14 DIAGNOSIS — K219 Gastro-esophageal reflux disease without esophagitis: Secondary | ICD-10-CM | POA: Diagnosis not present

## 2024-02-01 DIAGNOSIS — M2011 Hallux valgus (acquired), right foot: Secondary | ICD-10-CM | POA: Diagnosis not present

## 2024-02-01 DIAGNOSIS — M2142 Flat foot [pes planus] (acquired), left foot: Secondary | ICD-10-CM | POA: Diagnosis not present

## 2024-02-01 DIAGNOSIS — M2141 Flat foot [pes planus] (acquired), right foot: Secondary | ICD-10-CM | POA: Diagnosis not present

## 2024-02-01 DIAGNOSIS — M2012 Hallux valgus (acquired), left foot: Secondary | ICD-10-CM | POA: Diagnosis not present

## 2024-03-17 NOTE — Progress Notes (Unsigned)
 Office Note     CC:  follow up Requesting Provider:  Claudene Pellet, MD  HPI: Teresa Maddox is a 78 y.o. ([birth year]) female who presents for surveillance follow up of PAD. She was initially seen last September by Dr. Sheree for evaluation of left lower extremity claudication. Her symptoms were non lifestyle limiting. She was without any rest pain or tissue loss. She was started on 81 mg Aspirin  and a dedicate walking regimen was recommended.   Today she reports that her right foot has started to intermittently go numb. This started since her last visit in September - possibly 6 months or so. She says she can be walking, sitting, standing or even sleeping and it will occur. It is only in the right foot. She says its that falling asleep tingling feeling that becomes painful. It wakes her up from sleep at times. She says there is no radiation of pain or numbness down her leg. She denies any trouble with her hip or back. She does have pain in both of her feet but she was recently seen by Podiatry. She was told that she has very flat feet so she has been wearing orthotics. This however has not helped the pain or numbness. She denies any pain in her legs on ambulation. No tissue loss. She says this is very frustrating for her as she has always been very active. She cannot tolerate walking anymore now though. She sometimes uses a cane to assist her if she has to go long distances. She did purchase a foot peddle bike. She also has been wearing compression hose intermittently, which does seem to help her foot. Medically she is managed on Aspirin  and statin.    Past Medical History:  Diagnosis Date   GERD (gastroesophageal reflux disease)    HLD (hyperlipidemia)    Hypertension    Stroke [facility])     Past Surgical History:  Procedure Laterality Date   ABDOMINAL HYSTERECTOMY     KNEE SURGERY      Social History   Socioeconomic History   Marital status: Divorced    Spouse name: Not on file   Number  of children: Not on file   Years of education: Not on file   Highest education level: Not on file  Occupational History   Not on file  Tobacco Use   Smoking status: Never   Smokeless tobacco: Never  Substance and Sexual Activity   Alcohol use: No   Drug use: No   Sexual activity: Not on file  Other Topics Concern   Not on file  Social History Narrative   Not on file   Social Drivers of Health   Financial Resource Strain: Not on file  Food Insecurity: Not on file  Transportation Needs: Not on file  Physical Activity: Not on file  Stress: Not on file  Social Connections: Not on file  Intimate Partner Violence: Not on file    Family History  Problem Relation Age of Onset   CAD Mother    Breast cancer Neg Hx     Current Outpatient Medications  Medication Sig Dispense Refill   aspirin  EC 81 MG tablet Take 81 mg by mouth daily.     atorvastatin  (LIPITOR) 20 MG tablet Take 1 tablet (20 mg total) by mouth daily at 6 PM. 30 tablet 1   amLODipine  (NORVASC ) 5 MG tablet Take 5 mg by mouth daily.     cholecalciferol (VITAMIN D) 1000 units tablet Take 1,000 Units by mouth daily.  conjugated estrogens (PREMARIN) vaginal cream Place 1 applicator vaginally daily.     hydrochlorothiazide (HYDRODIURIL) 25 MG tablet 1 (one) time each day at the same time.     lisinopril  (PRINIVIL ,ZESTRIL ) 20 MG tablet Take 1 tablet (20 mg total) by mouth daily. 30 tablet 0   omeprazole  (PRILOSEC) 20 MG capsule Take 1 capsule (20 mg total) by mouth daily. 30 capsule 0   vitamin C (ASCORBIC ACID) 500 MG tablet Take 500 mg by mouth daily.     Zinc 100 MG TABS 1 tablet Orally Once a day     No current facility-administered medications for this visit.    Allergies  Allergen Reactions   Clindamycin/Lincomycin Itching     REVIEW OF SYSTEMS:  Negative unless noted in HPI [X]  denotes positive finding, [ ]  denotes negative finding Cardiac  Comments:  Chest pain or chest pressure:    Shortness of  breath upon exertion:    Short of breath when lying flat:    Irregular heart rhythm:        Vascular    Pain in calf, thigh, or hip brought on by ambulation:    Pain in feet at night that wakes you up from your sleep:     Blood clot in your veins:    Leg swelling:         Pulmonary    Oxygen at home:    Productive cough:     Wheezing:         Neurologic    Sudden weakness in arms or legs:     Sudden numbness in arms or legs:     Sudden onset of difficulty speaking or slurred speech:    Temporary loss of vision in one eye:     Problems with dizziness:         Gastrointestinal    Blood in stool:     Vomited blood:         Genitourinary    Burning when urinating:     Blood in urine:        Psychiatric    Major depression:         Hematologic    Bleeding problems:    Problems with blood clotting too easily:        Skin    Rashes or ulcers:        Constitutional    Fever or chills:      PHYSICAL EXAMINATION:  Vitals:   03/19/24 0921  BP: (!) 151/83  Pulse: 65  Temp: 97.9 F (36.6 C)  TempSrc: Temporal  Weight: 212 lb 12.8 oz (96.5 kg)    General:  WDWN in NAD; vital signs documented above Gait: Normal HENT: WNL, normocephalic Pulmonary: normal non-labored breathing Cardiac: regular HR Abdomen: soft Vascular Exam/Pulses: 2+ femoral pulses, monophasic right Pero, biphasic DP, biphasic left DP/PT/ Pero signals. Feet warm and well perfused Extremities: without ischemic changes, without Gangrene , without cellulitis; without open wounds;  Musculoskeletal: no muscle wasting or atrophy  Neurologic: A&O X 3 Psychiatric:  The pt has Normal affect.   Non-Invasive Vascular Imaging:   +-------+-----------+-----------+------------+------------+  ABI/TBIToday's ABIToday's TBIPrevious ABIPrevious TBI  +-------+-----------+-----------+------------+------------+  Right .76        .44        1.02        .96            +-------+-----------+-----------+------------+------------+  Left  .74        .69        .  71         .58           +-------+-----------+-----------+------------+------------+   Right ABIs and TBIs appear decreased compared to prior study on 02/26/2023. Left ABIs and TBIs appear essentially unchanged compared to prior study on 02/26/2023.   VAS US  Lower extremity arterial duplex:  Summary:  Left: Atherosclerosis throughout without evidence of stenosis from the CFA to the TPT. Tibial vessel disease. One vessel run-off via ATA, with 50-74% stenosis in the proximal segment distal to ostium, high end range.    ASSESSMENT/PLAN:: 78 y.o. female here for surveillance follow up of PAD. She was initially seen last September by Dr. Sheree for evaluation of left lower extremity claudication. Currently her LLE are improved however she has developed right foot numbness. Her symptoms could be start of development of rest pain. Her non invasive studies did decrease significantly on the RLE since her last visit. We did not duplex her RLE today, but based on her LLE duplex findings would suspect that she has significant tibial disease on the RLE. I discussed with patient continued conservative management vs. Proceeding with Angiography.  I discussed risks/ benefits of procedure including bleeding, arterial injury or dissection, thrombosis, or need for surgical intervention. Ultimately she feels that her symptoms are disruptive enough to her daily activity that she would like to proceed with Angiogram. - Continue Aspirin  and statin - encourage dedicated walking regimen - I will arrange Aortogram, arteriogram of BLE with possible right superficial femoral and/or tibial artery angioplasty and possible stenting in the near future with Dr. Sheree.    Teretha Damme, PA-C [facility] [phone]  Clinic MD:   Sheree

## 2024-03-17 NOTE — H&P (View-Only) (Signed)
 Office Note     CC:  follow up Requesting Provider:  Claudene Pellet, MD  HPI: Teresa Maddox is a 78 y.o. ([birth year]) female who presents for surveillance follow up of PAD. She was initially seen last September by Dr. Sheree for evaluation of left lower extremity claudication. Her symptoms were non lifestyle limiting. She was without any rest pain or tissue loss. She was started on 81 mg Aspirin  and a dedicate walking regimen was recommended.   Today she reports that her right foot has started to intermittently go numb. This started since her last visit in September - possibly 6 months or so. She says she can be walking, sitting, standing or even sleeping and it will occur. It is only in the right foot. She says its that falling asleep tingling feeling that becomes painful. It wakes her up from sleep at times. She says there is no radiation of pain or numbness down her leg. She denies any trouble with her hip or back. She does have pain in both of her feet but she was recently seen by Podiatry. She was told that she has very flat feet so she has been wearing orthotics. This however has not helped the pain or numbness. She denies any pain in her legs on ambulation. No tissue loss. She says this is very frustrating for her as she has always been very active. She cannot tolerate walking anymore now though. She sometimes uses a cane to assist her if she has to go long distances. She did purchase a foot peddle bike. She also has been wearing compression hose intermittently, which does seem to help her foot. Medically she is managed on Aspirin  and statin.    Past Medical History:  Diagnosis Date   GERD (gastroesophageal reflux disease)    HLD (hyperlipidemia)    Hypertension    Stroke [facility])     Past Surgical History:  Procedure Laterality Date   ABDOMINAL HYSTERECTOMY     KNEE SURGERY      Social History   Socioeconomic History   Marital status: Divorced    Spouse name: Not on file   Number  of children: Not on file   Years of education: Not on file   Highest education level: Not on file  Occupational History   Not on file  Tobacco Use   Smoking status: Never   Smokeless tobacco: Never  Substance and Sexual Activity   Alcohol use: No   Drug use: No   Sexual activity: Not on file  Other Topics Concern   Not on file  Social History Narrative   Not on file   Social Drivers of Health   Financial Resource Strain: Not on file  Food Insecurity: Not on file  Transportation Needs: Not on file  Physical Activity: Not on file  Stress: Not on file  Social Connections: Not on file  Intimate Partner Violence: Not on file    Family History  Problem Relation Age of Onset   CAD Mother    Breast cancer Neg Hx     Current Outpatient Medications  Medication Sig Dispense Refill   aspirin  EC 81 MG tablet Take 81 mg by mouth daily.     atorvastatin  (LIPITOR) 20 MG tablet Take 1 tablet (20 mg total) by mouth daily at 6 PM. 30 tablet 1   amLODipine  (NORVASC ) 5 MG tablet Take 5 mg by mouth daily.     cholecalciferol (VITAMIN D) 1000 units tablet Take 1,000 Units by mouth daily.  conjugated estrogens (PREMARIN) vaginal cream Place 1 applicator vaginally daily.     hydrochlorothiazide (HYDRODIURIL) 25 MG tablet 1 (one) time each day at the same time.     lisinopril  (PRINIVIL ,ZESTRIL ) 20 MG tablet Take 1 tablet (20 mg total) by mouth daily. 30 tablet 0   omeprazole  (PRILOSEC) 20 MG capsule Take 1 capsule (20 mg total) by mouth daily. 30 capsule 0   vitamin C (ASCORBIC ACID) 500 MG tablet Take 500 mg by mouth daily.     Zinc 100 MG TABS 1 tablet Orally Once a day     No current facility-administered medications for this visit.    Allergies  Allergen Reactions   Clindamycin/Lincomycin Itching     REVIEW OF SYSTEMS:  Negative unless noted in HPI [X]  denotes positive finding, [ ]  denotes negative finding Cardiac  Comments:  Chest pain or chest pressure:    Shortness of  breath upon exertion:    Short of breath when lying flat:    Irregular heart rhythm:        Vascular    Pain in calf, thigh, or hip brought on by ambulation:    Pain in feet at night that wakes you up from your sleep:     Blood clot in your veins:    Leg swelling:         Pulmonary    Oxygen at home:    Productive cough:     Wheezing:         Neurologic    Sudden weakness in arms or legs:     Sudden numbness in arms or legs:     Sudden onset of difficulty speaking or slurred speech:    Temporary loss of vision in one eye:     Problems with dizziness:         Gastrointestinal    Blood in stool:     Vomited blood:         Genitourinary    Burning when urinating:     Blood in urine:        Psychiatric    Major depression:         Hematologic    Bleeding problems:    Problems with blood clotting too easily:        Skin    Rashes or ulcers:        Constitutional    Fever or chills:      PHYSICAL EXAMINATION:  Vitals:   03/19/24 0921  BP: (!) 151/83  Pulse: 65  Temp: 97.9 F (36.6 C)  TempSrc: Temporal  Weight: 212 lb 12.8 oz (96.5 kg)    General:  WDWN in NAD; vital signs documented above Gait: Normal HENT: WNL, normocephalic Pulmonary: normal non-labored breathing Cardiac: regular HR Abdomen: soft Vascular Exam/Pulses: 2+ femoral pulses, monophasic right Pero, biphasic DP, biphasic left DP/PT/ Pero signals. Feet warm and well perfused Extremities: without ischemic changes, without Gangrene , without cellulitis; without open wounds;  Musculoskeletal: no muscle wasting or atrophy  Neurologic: A&O X 3 Psychiatric:  The pt has Normal affect.   Non-Invasive Vascular Imaging:   +-------+-----------+-----------+------------+------------+  ABI/TBIToday's ABIToday's TBIPrevious ABIPrevious TBI  +-------+-----------+-----------+------------+------------+  Right .76        .44        1.02        .96            +-------+-----------+-----------+------------+------------+  Left  .74        .69        .  71         .58           +-------+-----------+-----------+------------+------------+   Right ABIs and TBIs appear decreased compared to prior study on 02/26/2023. Left ABIs and TBIs appear essentially unchanged compared to prior study on 02/26/2023.   VAS US  Lower extremity arterial duplex:  Summary:  Left: Atherosclerosis throughout without evidence of stenosis from the CFA to the TPT. Tibial vessel disease. One vessel run-off via ATA, with 50-74% stenosis in the proximal segment distal to ostium, high end range.    ASSESSMENT/PLAN:: 78 y.o. female here for surveillance follow up of PAD. She was initially seen last September by Dr. Sheree for evaluation of left lower extremity claudication. Currently her LLE are improved however she has developed right foot numbness. Her symptoms could be start of development of rest pain. Her non invasive studies did decrease significantly on the RLE since her last visit. We did not duplex her RLE today, but based on her LLE duplex findings would suspect that she has significant tibial disease on the RLE. I discussed with patient continued conservative management vs. Proceeding with Angiography.  I discussed risks/ benefits of procedure including bleeding, arterial injury or dissection, thrombosis, or need for surgical intervention. Ultimately she feels that her symptoms are disruptive enough to her daily activity that she would like to proceed with Angiogram. - Continue Aspirin  and statin - encourage dedicated walking regimen - I will arrange Aortogram, arteriogram of BLE with possible right superficial femoral and/or tibial artery angioplasty and possible stenting in the near future with Dr. Sheree.    Teretha Damme, PA-C [facility] [phone]  Clinic MD:   Sheree

## 2024-03-19 ENCOUNTER — Ambulatory Visit (HOSPITAL_COMMUNITY)
Admission: RE | Admit: 2024-03-19 | Discharge: 2024-03-19 | Disposition: A | Discharge status: Home / Self Care | Source: Ambulatory Visit | Attending: Vascular Surgery | Admitting: Vascular Surgery

## 2024-03-19 ENCOUNTER — Ambulatory Visit: Attending: Vascular Surgery | Admitting: Physician Assistant

## 2024-03-19 ENCOUNTER — Other Ambulatory Visit: Payer: Self-pay

## 2024-03-19 ENCOUNTER — Ambulatory Visit (HOSPITAL_BASED_OUTPATIENT_CLINIC_OR_DEPARTMENT_OTHER)
Admission: RE | Admit: 2024-03-19 | Discharge: 2024-03-19 | Disposition: A | Discharge status: Home / Self Care | Source: Ambulatory Visit | Attending: Vascular Surgery | Admitting: Vascular Surgery

## 2024-03-19 VITALS — BP 151/83 | HR 65 | Temp 97.9°F | Wt 212.8 lb

## 2024-03-19 DIAGNOSIS — I70212 Atherosclerosis of native arteries of extremities with intermittent claudication, left leg: Secondary | ICD-10-CM

## 2024-03-19 DIAGNOSIS — I70221 Atherosclerosis of native arteries of extremities with rest pain, right leg: Secondary | ICD-10-CM

## 2024-03-19 DIAGNOSIS — I739 Peripheral vascular disease, unspecified: Secondary | ICD-10-CM

## 2024-03-19 LAB — VAS US ABI WITH/WO TBI
Left ABI: 0.74
Right ABI: 0.76

## 2024-04-07 ENCOUNTER — Other Ambulatory Visit: Payer: Self-pay

## 2024-04-07 ENCOUNTER — Ambulatory Visit (HOSPITAL_COMMUNITY)
Admission: RE | Admit: 2024-04-07 | Discharge: 2024-04-07 | Disposition: A | Discharge status: Home / Self Care | Attending: Vascular Surgery | Admitting: Vascular Surgery

## 2024-04-07 ENCOUNTER — Encounter (HOSPITAL_COMMUNITY)
Admission: RE | Disposition: A | Discharge status: Home / Self Care | Payer: Self-pay | Source: Home / Self Care | Attending: Vascular Surgery

## 2024-04-07 DIAGNOSIS — Z7982 Long term (current) use of aspirin: Secondary | ICD-10-CM | POA: Insufficient documentation

## 2024-04-07 DIAGNOSIS — Z79899 Other long term (current) drug therapy: Secondary | ICD-10-CM | POA: Insufficient documentation

## 2024-04-07 DIAGNOSIS — I70221 Atherosclerosis of native arteries of extremities with rest pain, right leg: Secondary | ICD-10-CM | POA: Diagnosis present

## 2024-04-07 DIAGNOSIS — I701 Atherosclerosis of renal artery: Secondary | ICD-10-CM | POA: Diagnosis not present

## 2024-04-07 DIAGNOSIS — I70212 Atherosclerosis of native arteries of extremities with intermittent claudication, left leg: Secondary | ICD-10-CM | POA: Diagnosis not present

## 2024-04-07 HISTORY — PX: LOWER EXTREMITY INTERVENTION: CATH118252

## 2024-04-07 HISTORY — PX: ABDOMINAL AORTOGRAM W/LOWER EXTREMITY: CATH118223

## 2024-04-07 HISTORY — PX: LOWER EXTREMITY ANGIOGRAPHY: CATH118251

## 2024-04-07 LAB — POCT I-STAT, CHEM 8
BUN: 24 mg/dL — ABNORMAL HIGH (ref 8–23)
Calcium, Ion: 1.37 mmol/L (ref 1.15–1.40)
Chloride: 105 mmol/L (ref 98–111)
Creatinine, Ser: 1 mg/dL (ref 0.44–1.00)
Glucose, Bld: 98 mg/dL (ref 70–99)
HCT: 34 % — ABNORMAL LOW (ref 36.0–46.0)
Hemoglobin: 11.6 g/dL — ABNORMAL LOW (ref 12.0–15.0)
Potassium: 3.8 mmol/L (ref 3.5–5.1)
Sodium: 143 mmol/L (ref 135–145)
TCO2: 27 mmol/L (ref 22–32)

## 2024-04-07 SURGERY — ABDOMINAL AORTOGRAM W/LOWER EXTREMITY
Anesthesia: LOCAL | Laterality: Right

## 2024-04-07 MED ORDER — IODIXANOL 320 MG/ML IV SOLN
INTRAVENOUS | Status: DC | PRN
  Administered 2024-04-07: 105 mL via INTRA_ARTERIAL

## 2024-04-07 MED ORDER — CLOPIDOGREL BISULFATE 300 MG PO TABS
ORAL_TABLET | ORAL | Status: DC | PRN
  Administered 2024-04-07: 300 mg via ORAL

## 2024-04-07 MED ORDER — CLOPIDOGREL BISULFATE 300 MG PO TABS
ORAL_TABLET | ORAL | Status: AC
  Filled 2024-04-07: qty 1

## 2024-04-07 MED ORDER — SODIUM CHLORIDE 0.9 % IV SOLN: INTRAVENOUS | Status: DC

## 2024-04-07 MED ORDER — MIDAZOLAM HCL 2 MG/2ML IJ SOLN
INTRAMUSCULAR | Status: AC
  Filled 2024-04-07: qty 2

## 2024-04-07 MED ORDER — MIDAZOLAM HCL (PF) 2 MG/2ML IJ SOLN
INTRAMUSCULAR | Status: DC | PRN
  Administered 2024-04-07: 1 mg via INTRAVENOUS

## 2024-04-07 MED ORDER — FENTANYL CITRATE (PF) 100 MCG/2ML IJ SOLN
INTRAMUSCULAR | Status: AC
  Filled 2024-04-07: qty 2

## 2024-04-07 MED ORDER — LIDOCAINE HCL (PF) 1 % IJ SOLN
INTRAMUSCULAR | Status: DC | PRN
  Administered 2024-04-07: 20 mL

## 2024-04-07 MED ORDER — HEPARIN SODIUM (PORCINE) 1000 UNIT/ML IJ SOLN
INTRAMUSCULAR | Status: AC
  Filled 2024-04-07: qty 10

## 2024-04-07 MED ORDER — FENTANYL CITRATE (PF) 100 MCG/2ML IJ SOLN
INTRAMUSCULAR | Status: DC | PRN
  Administered 2024-04-07: 50 ug via INTRAVENOUS

## 2024-04-07 MED ORDER — HEPARIN SODIUM (PORCINE) 1000 UNIT/ML IJ SOLN
INTRAMUSCULAR | Status: DC | PRN
  Administered 2024-04-07: 8000 [IU] via INTRAVENOUS

## 2024-04-07 MED ORDER — CLOPIDOGREL BISULFATE 75 MG PO TABS: 75.0000 mg | ORAL_TABLET | Freq: Every day | ORAL | 11 refills | Status: AC

## 2024-04-07 MED ORDER — HEPARIN (PORCINE) IN NACL 1000-0.9 UT/500ML-% IV SOLN
INTRAVENOUS | Status: DC | PRN
  Administered 2024-04-07 (×2): 500 mL

## 2024-04-07 SURGICAL SUPPLY — 16 items
BALLN STERLING OTW 2.5X220X150 (BALLOONS) IMPLANT
BALLOON STERLING OTW 3X60X150 (BALLOONS) IMPLANT
CATH CXI SUPP ST 4FR 135CM (CATHETERS) IMPLANT
CATH OMNI FLUSH 5F 65CM (CATHETERS) IMPLANT
CLOSURE MYNX CONTROL 6F/7F (Vascular Products) IMPLANT
KIT ENCORE 26 ADVANTAGE (KITS) IMPLANT
KIT MICROPUNCTURE NIT STIFF (SHEATH) IMPLANT
KIT SINGLE USE MANIFOLD (KITS) IMPLANT
PACK CARDIAC CATHETERIZATION (CUSTOM PROCEDURE TRAY) IMPLANT
SET ATX-X65L (MISCELLANEOUS) IMPLANT
SHEATH CATAPULT 6FR 60 (SHEATH) IMPLANT
SHEATH PINNACLE 5F 10CM (SHEATH) IMPLANT
SHEATH PINNACLE 6F 10CM (SHEATH) IMPLANT
SHEATH PROBE COVER 6X72 (BAG) IMPLANT
WIRE BENTSON .035X145CM (WIRE) IMPLANT
WIRE G V18X300CM (WIRE) IMPLANT

## 2024-04-07 NOTE — Op Note (Signed)
 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 Sheree, MD Procedure Performed: 1.  Percutaneous ultrasound-guided cannulation and Mynx device closure left common femoral artery 2.  Catheter selection of aorta and aortogram with bilateral lower extremity angiography 3.  Plain balloon angioplasty right anterior tibial artery with 2.5 mm and 3 mm Sterling balloons 4.  Moderate sedation with fentanyl  and Versed for 48 minutes   Indications: 78 year old female previously followed for bilateral lower extremity claudication now with right lower extremity numbness and pain with suggestion of tibial occlusive disease.  She is indicated for angiography with possible intervention.  Findings: Aorta and iliac segments are free of flow-limiting stenosis.  The left renal artery has approximately 90% stenosis.  Bilateral common femoral arteries are patent.  Bilateral superficial femoral artery and popliteal arteries are patent.  She has bilateral below the knee disease.  On the right side which is the site of interest the takeoff of the anterior tibial artery is stenosed approximately 60% and the artery then is diseased proximally and frankly occludes in the midsegment but reconstitutes as the major vessel onto the foot.  The peroneal artery initially is patent and has multiple areas of flow-limiting stenosis all the way to the ankle and does not give much flow into the foot.  After intervention of the anterior tibial artery we had brisk flow all the way to the foot filling the dorsalis pedis artery.  There is no areas of stenosis greater than 10% where previously was frankly occluded with multiple areas of 90% stenosis.  There is suggestion of possible dissection flap which is nonflow limiting I elected for no stenting.  On the left  side below the knee she has again severe disease.  The anterior tibial and peroneal arteries are patent however both frankly occlude in the anterior tibial artery then reconstitutes as the dominant flow into the foot.  Plan will be to continue dual antiplatelet therapy and statin.  She is at high risk of requiring left lower extremity intervention.   Procedure:  The patient was identified in the holding area and taken to room 8.  The patient was then placed supine on the table and prepped and draped in the usual sterile fashion.  A time out was called.  Ultrasound was used to evaluate the left common femoral artery which was free of disease.  The area was anesthetized 1% lidocaine and cannulated micropuncture needle followed by wire and sheath.  Ultrasound images saved to the permanent record.  Concomitantly we administered fentanyl  and Versed as moderate sedation her vital signs were monitored throughout the case.  We placed a Bentson wire followed by 5 French sheath and Omni catheter was placed at the level of L1 and aortogram was performed followed by pelvic angiography.  We then crossed the bifurcation perform right lower extremity angiography and with the above findings we placed a long 6 French sheath into the right SFA and the patient was fully heparinized.  Using a V18 wire and a CXI catheter we were able to traverse the anterior tibial artery all the way to the level of the foot.  We then began by ballooning distally with 2.5 mm balloon at nominal pressure and proximally with 3 mm balloon at nominal pressure.  After the proximal balloon there appeared to be a nonflow-limiting dissection and a 3 mm balloon was then  again inflated at low pressure 3 atm for 3 minutes.  There was still suggestion at completion of possible dissection but I did not want to stent and without any flow limitation I elected to terminate the procedure.  We then exchanged over a Bentson wire for a short 6 French sheath and perform  left lower extremity angiography with the above findings.  We then plated Mynx device.  She tolerated the procedure without immediate complication.  Contrast: 105cc   Ottis Vacha C. Sheree, MD [facility] [place] Office: [phone] Pager: [phone]

## 2024-04-07 NOTE — Progress Notes (Signed)
 PT ambulated nt he hallway, voided int he bathroom tolerated PO intake no complaints of N/V. No s/s/ of complications at the incision site. Discharge instructions reviewed with pt and friend Naomie sharps at bedside. Pt was escorted from the unit via wheel chair to personal vehicle.

## 2024-04-07 NOTE — Interval H&P Note (Signed)
 History and Physical Interval Note:  04/07/2024 7:21 AM  Teresa Maddox  has presented today for surgery, with the diagnosis of atherosclerosis right lower.  The various methods of treatment have been discussed with the patient and family. After consideration of risks, benefits and other options for treatment, the patient has consented to  Procedure(s): ABDOMINAL AORTOGRAM W/LOWER EXTREMITY (N/A) Lower Extremity Angiography (N/A) LOWER EXTREMITY INTERVENTION (N/A) as a surgical intervention.  The patient's history has been reviewed, patient examined, no change in status, stable for surgery.  I have reviewed the patient's chart and labs.  Questions were answered to the patient's satisfaction.     [name]

## 2024-04-07 NOTE — Discharge Instructions (Signed)
 Femoral Site Care This sheet gives you information about how to care for yourself after your procedure. Your health care provider may also give you more specific instructions. If you have problems or questions, contact your health care provider. What can I expect after the procedure?  After the procedure, it is common to have: Bruising that usually fades within 1-2 weeks. Tenderness at the site. Follow these instructions at home: Wound care Follow instructions from your health care provider about how to take care of your insertion site. Make sure you: Wash your hands with soap and water before you change your bandage (dressing). If soap and water are not available, use hand sanitizer. Remove your dressing as told by your health care provider. In 24 hours Do not take baths, swim, or use a hot tub until your health care provider approves. You may shower 24-48 hours after the procedure or as told by your health care provider. Gently wash the site with plain soap and water. Pat the area dry with a clean towel. Do not rub the site. This may cause bleeding. Do not apply powder or lotion to the site. Keep the site clean and dry. Check your femoral site every day for signs of infection. Check for: Redness, swelling, or pain. Fluid or blood. Warmth. Pus or a bad smell. Activity For the first 2-3 days after your procedure, or as long as directed: Avoid climbing stairs as much as possible. Do not squat. Do not lift anything that is heavier than 10 lb (4.5 kg), or the limit that you are told, until your health care provider says that it is safe. For 5 days Rest as directed. Avoid sitting for a long time without moving. Get up to take short walks every 1-2 hours. Do not drive for 24 hours if you were given a medicine to help you relax (sedative). General instructions Take over-the-counter and prescription medicines only as told by your health care provider. Keep all follow-up visits as told by  your health care provider. This is important. Contact a health care provider if you have: A fever or chills. You have redness, swelling, or pain around your insertion site. Get help right away if: The catheter insertion area swells very fast. You pass out. You suddenly start to sweat or your skin gets clammy. The catheter insertion area is bleeding, and the bleeding does not stop when you hold steady pressure on the area. The area near or just beyond the catheter insertion site becomes pale, cool, tingly, or numb. These symptoms may represent a serious problem that is an emergency. Do not wait to see if the symptoms will go away. Get medical help right away. Call your local emergency services (911 in the [place] Do not drive yourself to the hospital. Summary After the procedure, it is common to have bruising that usually fades within 1-2 weeks. Check your femoral site every day for signs of infection. Do not lift anything that is heavier than 10 lb (4.5 kg), or the limit that you are told, until your health care provider says that it is safe. This information is not intended to replace advice given to you by your health care provider. Make sure you discuss any questions you have with your health care provider. Document Revised: 06/11/2017 Document Reviewed: 06/11/2017 Elsevier Patient Education  2020 [employer].

## 2024-04-08 ENCOUNTER — Encounter (HOSPITAL_COMMUNITY): Payer: Self-pay | Admitting: Vascular Surgery

## 2024-04-09 ENCOUNTER — Other Ambulatory Visit: Payer: Self-pay

## 2024-04-09 DIAGNOSIS — I70221 Atherosclerosis of native arteries of extremities with rest pain, right leg: Secondary | ICD-10-CM

## 2024-05-14 ENCOUNTER — Ambulatory Visit (HOSPITAL_COMMUNITY)
Admission: RE | Admit: 2024-05-14 | Discharge: 2024-05-14 | Disposition: A | Discharge status: Home / Self Care | Source: Ambulatory Visit | Attending: Vascular Surgery | Admitting: Vascular Surgery

## 2024-05-14 ENCOUNTER — Encounter: Payer: Self-pay | Admitting: Vascular Surgery

## 2024-05-14 ENCOUNTER — Ambulatory Visit (HOSPITAL_BASED_OUTPATIENT_CLINIC_OR_DEPARTMENT_OTHER): Admit: 2024-05-14 | Discharge: 2024-05-14 | Disposition: A | Attending: Vascular Surgery

## 2024-05-14 ENCOUNTER — Ambulatory Visit: Admitting: Vascular Surgery

## 2024-05-14 VITALS — BP 134/85 | HR 70 | Temp 97.8°F | Ht 66.0 in | Wt 209.0 lb

## 2024-05-14 DIAGNOSIS — I70221 Atherosclerosis of native arteries of extremities with rest pain, right leg: Secondary | ICD-10-CM | POA: Insufficient documentation

## 2024-05-14 LAB — VAS US ABI WITH/WO TBI
Left ABI: 0.74
Right ABI: 1.01

## 2024-05-14 NOTE — Progress Notes (Signed)
 Patient ID: Teresa Maddox, female   DOB: 18-Sep-1945, 78 y.o.   MRN: [id number]  Reason for Consult: Routine Post Op   Referred by Claudene Pellet, MD  Subjective:     HPI:  Teresa Maddox is a 78 y.o. female recently underwent plain balloon angioplasty of the right anterior tibial artery for rest pain.  She states that she does have some occasional numbness in her right great toe but overall her pain has resolved she is walking without limitation and is not having any pain in the middle the night.  Her left leg has occasional numbness but no other symptoms.  Past Medical History:  Diagnosis Date   GERD (gastroesophageal reflux disease)    HLD (hyperlipidemia)    Hypertension    Stroke [facility])    Family History  Problem Relation Age of Onset   CAD Mother    Breast cancer Neg Hx    Past Surgical History:  Procedure Laterality Date   ABDOMINAL AORTOGRAM W/LOWER EXTREMITY N/A 04/07/2024   Procedure: ABDOMINAL AORTOGRAM W/LOWER EXTREMITY;  Surgeon: Sheree Penne Bruckner, MD;  Location: [facility] INVASIVE CV LAB;  Service: Cardiovascular;  Laterality: N/A;   ABDOMINAL HYSTERECTOMY     KNEE SURGERY     LOWER EXTREMITY ANGIOGRAPHY Bilateral 04/07/2024   Procedure: Lower Extremity Angiography;  Surgeon: Sheree Penne Bruckner, MD;  Location: [facility] INVASIVE CV LAB;  Service: Cardiovascular;  Laterality: Bilateral;   LOWER EXTREMITY INTERVENTION Right 04/07/2024   Procedure: LOWER EXTREMITY INTERVENTION;  Surgeon: Sheree Penne Bruckner, MD;  Location: [facility] INVASIVE CV LAB;  Service: Cardiovascular;  Laterality: Right;    Short Social History:  Social History   Tobacco Use   Smoking status: Never   Smokeless tobacco: Never  Substance Use Topics   Alcohol use: No    Allergies  Allergen Reactions   Clindamycin/Lincomycin Itching    Current Outpatient Medications  Medication Sig Dispense Refill   amLODipine  (NORVASC ) 5 MG tablet Take 5 mg by mouth daily.     aspirin  EC 81 MG  tablet Take 81 mg by mouth daily.     atorvastatin  (LIPITOR) 20 MG tablet Take 1 tablet (20 mg total) by mouth daily at 6 PM. 30 tablet 1   cholecalciferol (VITAMIN D) 1000 units tablet Take 1,000 Units by mouth daily.     clopidogrel  (PLAVIX ) 75 MG tablet Take 1 tablet (75 mg total) by mouth daily. 30 tablet 11   hydrochlorothiazide (HYDRODIURIL) 25 MG tablet Take 25 mg by mouth daily.     lisinopril  (PRINIVIL ,ZESTRIL ) 20 MG tablet Take 1 tablet (20 mg total) by mouth daily. 30 tablet 0   magnesium oxide (MAG-OX) 400 (240 Mg) MG tablet Take 400 mg by mouth 4 (four) times a week.     omeprazole  (PRILOSEC) 20 MG capsule Take 1 capsule (20 mg total) by mouth daily. 30 capsule 0   vitamin C (ASCORBIC ACID) 500 MG tablet Take 500 mg by mouth daily.     Zinc 50 MG TABS Take 50 mg by mouth daily.     No current facility-administered medications for this visit.    Review of Systems  Constitutional:  Constitutional negative. HENT: HENT negative.  Eyes: Eyes negative.  Respiratory: Respiratory negative.  Cardiovascular: Cardiovascular negative.  GI: Gastrointestinal negative.  Musculoskeletal: Musculoskeletal negative.  Skin: Skin negative.  Neurological: Positive for numbness.  Hematologic: Hematologic/lymphatic negative.  Psychiatric: Psychiatric negative.        Objective:  Objective   Vitals:   05/14/24 1520  BP:  134/85  Pulse: 70  Temp: 97.8 F (36.6 C)  SpO2: 98%  Weight: 209 lb (94.8 kg)  Height: 5' 6 (1.676 m)   Body mass index is 33.73 kg/m.  Physical Exam HENT:     Head: Normocephalic.     Nose: Nose normal.     Mouth/Throat:     Mouth: Mucous membranes are moist.  Cardiovascular:     Rate and Rhythm: Normal rate.     Pulses:          Dorsalis pedis pulses are 2+ on the right side and 0 on the left side.       Posterior tibial pulses are 0 on the right side and 0 on the left side.  Pulmonary:     Effort: Pulmonary effort is normal.  Musculoskeletal:         General: Normal range of motion.     Right lower leg: No edema.     Left lower leg: No edema.  Skin:    General: Skin is warm.     Capillary Refill: Capillary refill takes less than 2 seconds.  Neurological:     General: No focal deficit present.     Mental Status: She is alert.  Psychiatric:        Mood and Affect: Mood normal.     Data: ABI Findings:  +---------+------------------+-----+-----------+--------+  Right   Rt Pressure (mmHg)IndexWaveform   Comment   +---------+------------------+-----+-----------+--------+  Brachial 133                                         +---------+------------------+-----+-----------+--------+  PTA     110               0.76 biphasic             +---------+------------------+-----+-----------+--------+  DP      146               1.01 multiphasic          +---------+------------------+-----+-----------+--------+  Great Toe104               0.72 Normal               +---------+------------------+-----+-----------+--------+   +---------+------------------+-----+-----------+-------+  Left    Lt Pressure (mmHg)IndexWaveform   Comment  +---------+------------------+-----+-----------+-------+  Brachial 144                                        +---------+------------------+-----+-----------+-------+  PTA     100               0.69 biphasic            +---------+------------------+-----+-----------+-------+  DP      107               0.74 multiphasic         +---------+------------------+-----+-----------+-------+  Great Toe75                0.52 Normal              +---------+------------------+-----+-----------+-------+   +-------+-----------+-----------+------------+------------+  ABI/TBIToday's ABIToday's TBIPrevious ABIPrevious TBI  +-------+-----------+-----------+------------+------------+  Right 1.01       0.72       0.76        0.44           +-------+-----------+-----------+------------+------------+  Left  0.74       0.52       0.74        0.69          +-------+-----------+-----------+------------+------------+       Right ABIs appear increased compared to prior study on 03/19/24.    Summary:  Right: Resting right ankle-brachial index is within normal range. The  right toe-brachial index is normal.    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 The left toe-brachial index is abnormal.  Left toe pressure is >60 mmHg which suggests adequate perfusion for  healing.   +-----------+--------+-----+--------+---------+--------+  RIGHT     PSV cm/sRatioStenosisWaveform Comments  +-----------+--------+-----+--------+---------+--------+  CFA Distal 82                   triphasic          +-----------+--------+-----+--------+---------+--------+  DFA       45                   triphasic          +-----------+--------+-----+--------+---------+--------+  SFA Prox   69                   triphasic          +-----------+--------+-----+--------+---------+--------+  SFA Mid    79                   triphasic          +-----------+--------+-----+--------+---------+--------+  SFA Distal 71                   biphasic           +-----------+--------+-----+--------+---------+--------+  POP Prox   63                   triphasic          +-----------+--------+-----+--------+---------+--------+  POP Distal 33                   biphasic           +-----------+--------+-----+--------+---------+--------+  ATA Distal 121                  triphasic          +-----------+--------+-----+--------+---------+--------+  PTA Distal 41                   triphasic          +-----------+--------+-----+--------+---------+--------+  PERO Distal45                   triphasic           +-----------+--------+-----+--------+---------+--------+     Summary:  Right: No evidence of arterial disease seen throughout right LE.      Assessment/Plan:    78 year old female with history of as above recent right anterior tibial artery balloon angioplasty now with resolution of symptoms and 2+ palpable dorsalis pedis pulse.  She has similar disease process on the left however at this time remains mostly asymptomatic.  She will continue aspirin  and Plavix  and follow-up in 6 months with repeat noninvasive imaging studies.  She is planned to have dental work and should be okay for Plavix  interruption 5 days prior to the procedure as long as she waits until the new year.  She is on a statin and her cholesterol is followed by her primary care doctor.     Penne Bruckner  Sheree MD [facility]

## 2024-05-15 ENCOUNTER — Other Ambulatory Visit: Payer: Self-pay | Admitting: *Deleted

## 2024-05-15 DIAGNOSIS — I739 Peripheral vascular disease, unspecified: Secondary | ICD-10-CM

## 2024-05-15 DIAGNOSIS — I70221 Atherosclerosis of native arteries of extremities with rest pain, right leg: Secondary | ICD-10-CM

## 2024-12-03 ENCOUNTER — Ambulatory Visit (HOSPITAL_COMMUNITY)

## 2024-12-03 ENCOUNTER — Ambulatory Visit
